# Patient Record
Sex: Male | Born: 1950 | Race: Black or African American | Hispanic: No | Smoking: Current every day smoker
Health system: Southern US, Community
[De-identification: ages and names within clinical notes are randomized; demographics above are authoritative.]

## PROBLEM LIST (undated history)

## (undated) DIAGNOSIS — K219 Gastro-esophageal reflux disease without esophagitis: Secondary | ICD-10-CM

## (undated) DIAGNOSIS — M1990 Unspecified osteoarthritis, unspecified site: Secondary | ICD-10-CM

## (undated) DIAGNOSIS — I10 Essential (primary) hypertension: Secondary | ICD-10-CM

## (undated) DIAGNOSIS — E119 Type 2 diabetes mellitus without complications: Secondary | ICD-10-CM

## (undated) HISTORY — PX: TRACHEOSTOMY: SUR1362

## (undated) HISTORY — PX: COLONOSCOPY: SHX174

---

## 2004-07-07 ENCOUNTER — Emergency Department: Payer: Self-pay | Admitting: Unknown Physician Specialty

## 2006-02-07 DIAGNOSIS — I639 Cerebral infarction, unspecified: Secondary | ICD-10-CM

## 2006-02-07 HISTORY — DX: Cerebral infarction, unspecified: I63.9

## 2008-10-24 ENCOUNTER — Emergency Department: Payer: Self-pay | Admitting: Emergency Medicine

## 2009-02-07 ENCOUNTER — Inpatient Hospital Stay: Payer: Self-pay | Admitting: Unknown Physician Specialty

## 2009-12-27 ENCOUNTER — Emergency Department: Payer: Self-pay | Admitting: Emergency Medicine

## 2012-03-12 ENCOUNTER — Emergency Department: Payer: Self-pay | Admitting: Emergency Medicine

## 2012-03-12 LAB — CBC
HGB: 13.3 g/dL (ref 13.0–18.0)
MCH: 29.2 pg (ref 26.0–34.0)
MCV: 88 fL (ref 80–100)
Platelet: 206 10*3/uL (ref 150–440)
RBC: 4.56 10*6/uL (ref 4.40–5.90)
RDW: 14.1 % (ref 11.5–14.5)

## 2012-03-12 LAB — COMPREHENSIVE METABOLIC PANEL
BUN: 14 mg/dL (ref 7–18)
Bilirubin,Total: 0.5 mg/dL (ref 0.2–1.0)
Calcium, Total: 8.6 mg/dL (ref 8.5–10.1)
Chloride: 104 mmol/L (ref 98–107)
EGFR (African American): >60
EGFR (Non-African Amer.): >60
Glucose: 126 mg/dL — ABNORMAL HIGH (ref 65–99)
Potassium: 3.4 mmol/L — ABNORMAL LOW (ref 3.5–5.1)
SGOT(AST): 23 U/L (ref 15–37)
SGPT (ALT): 30 U/L (ref 12–78)
Total Protein: 7.3 g/dL (ref 6.4–8.2)

## 2012-03-13 LAB — TROPONIN I: Troponin-I: 0.02 ng/mL

## 2012-04-06 ENCOUNTER — Inpatient Hospital Stay: Payer: Self-pay | Admitting: Internal Medicine

## 2012-04-06 LAB — URINALYSIS, COMPLETE
Bacteria: NONE SEEN
Blood: NEGATIVE
Hyaline Cast: 1
Leukocyte Esterase: NEGATIVE
Ph: 5 (ref 4.5–8.0)
Protein: NEGATIVE
RBC,UR: 3 /HPF (ref 0–5)
Specific Gravity: 1.02 (ref 1.003–1.030)

## 2012-04-06 LAB — CBC
HCT: 42.5 % (ref 40.0–52.0)
MCV: 89 fL (ref 80–100)
RBC: 4.76 10*6/uL (ref 4.40–5.90)
RDW: 14.2 % (ref 11.5–14.5)

## 2012-04-06 LAB — COMPREHENSIVE METABOLIC PANEL
Albumin: 3.6 g/dL (ref 3.4–5.0)
Alkaline Phosphatase: 56 U/L (ref 50–136)
BUN: 17 mg/dL (ref 7–18)
Chloride: 104 mmol/L (ref 98–107)
Co2: 25 mmol/L (ref 21–32)
Creatinine: 1.33 mg/dL — ABNORMAL HIGH (ref 0.60–1.30)
EGFR (Non-African Amer.): 57 — ABNORMAL LOW
Osmolality: 271 (ref 275–301)
Potassium: 3.6 mmol/L (ref 3.5–5.1)
SGOT(AST): 31 U/L (ref 15–37)
SGPT (ALT): 36 U/L (ref 12–78)
Sodium: 135 mmol/L — ABNORMAL LOW (ref 136–145)
Total Protein: 7.2 g/dL (ref 6.4–8.2)

## 2012-04-06 LAB — TROPONIN I: Troponin-I: <0.02 ng/mL

## 2012-04-07 LAB — COMPREHENSIVE METABOLIC PANEL
Bilirubin,Total: 0.7 mg/dL (ref 0.2–1.0)
Creatinine: 1.24 mg/dL (ref 0.60–1.30)
EGFR (African American): >60
Glucose: 63 mg/dL — ABNORMAL LOW (ref 65–99)
Potassium: 3.1 mmol/L — ABNORMAL LOW (ref 3.5–5.1)
SGPT (ALT): 33 U/L (ref 12–78)

## 2012-04-07 LAB — CBC WITH DIFFERENTIAL/PLATELET
Basophil #: 0 10*3/uL (ref 0.0–0.1)
Basophil %: 0.5 %
Eosinophil %: 0.9 %
HGB: 14.1 g/dL (ref 13.0–18.0)
Lymphocyte #: 1 10*3/uL (ref 1.0–3.6)
Lymphocyte %: 27.3 %
MCH: 29.6 pg (ref 26.0–34.0)
MCHC: 33.6 g/dL (ref 32.0–36.0)
MCV: 88 fL (ref 80–100)
Neutrophil %: 61.6 %
Platelet: 203 10*3/uL (ref 150–440)
RBC: 4.77 10*6/uL (ref 4.40–5.90)
WBC: 3.8 10*3/uL (ref 3.8–10.6)

## 2012-04-07 LAB — PROTIME-INR: INR: 1

## 2012-04-07 LAB — LIPID PANEL
HDL Cholesterol: 49 mg/dL (ref 40–60)
VLDL Cholesterol, Calc: 11 mg/dL (ref 5–40)

## 2012-04-07 LAB — TROPONIN I: Troponin-I: <0.02 ng/mL

## 2012-04-07 LAB — MAGNESIUM: Magnesium: 1.7 mg/dL — ABNORMAL LOW

## 2012-04-07 LAB — APTT: Activated PTT: 52.8 secs — ABNORMAL HIGH (ref 23.6–35.9)

## 2012-04-07 LAB — HEMOGLOBIN A1C: Hemoglobin A1C: 7.1 % — ABNORMAL HIGH (ref 4.2–6.3)

## 2012-04-08 DIAGNOSIS — I6789 Other cerebrovascular disease: Secondary | ICD-10-CM

## 2012-06-08 ENCOUNTER — Inpatient Hospital Stay: Payer: Self-pay | Admitting: Internal Medicine

## 2012-06-08 LAB — CBC WITH DIFFERENTIAL/PLATELET
Basophil #: 0.1 10*3/uL (ref 0.0–0.1)
Eosinophil %: 2.8 %
Lymphocyte #: 2.1 10*3/uL (ref 1.0–3.6)
Lymphocyte %: 44.7 %
MCH: 30.1 pg (ref 26.0–34.0)
MCV: 85 fL (ref 80–100)
Monocyte #: 0.6 x10 3/mm (ref 0.2–1.0)
Neutrophil %: 37.8 %
RDW: 14.3 % (ref 11.5–14.5)
WBC: 4.6 10*3/uL (ref 3.8–10.6)

## 2012-06-08 LAB — COMPREHENSIVE METABOLIC PANEL
Alkaline Phosphatase: 58 U/L (ref 50–136)
Anion Gap: 5 — ABNORMAL LOW (ref 7–16)
BUN: 16 mg/dL (ref 7–18)
Calcium, Total: 9.4 mg/dL (ref 8.5–10.1)
Co2: 29 mmol/L (ref 21–32)
Creatinine: 1.14 mg/dL (ref 0.60–1.30)
Potassium: 3.6 mmol/L (ref 3.5–5.1)
SGPT (ALT): 32 U/L (ref 12–78)
Sodium: 137 mmol/L (ref 136–145)
Total Protein: 7.4 g/dL (ref 6.4–8.2)

## 2012-06-08 LAB — PROTIME-INR
INR: 1
Prothrombin Time: 13 secs (ref 11.5–14.7)

## 2013-03-17 LAB — TROPONIN I: Troponin-I: 0.05 ng/mL

## 2013-03-17 LAB — COMPREHENSIVE METABOLIC PANEL
ALBUMIN: 4.5 g/dL (ref 3.4–5.0)
ALT: 52 U/L (ref 12–78)
AST: 26 U/L (ref 15–37)
Alkaline Phosphatase: 86 U/L
Anion Gap: 8 (ref 7–16)
BILIRUBIN TOTAL: 0.9 mg/dL (ref 0.2–1.0)
BUN: 13 mg/dL (ref 7–18)
CHLORIDE: 98 mmol/L (ref 98–107)
CREATININE: 1.39 mg/dL — AB (ref 0.60–1.30)
Calcium, Total: 10.9 mg/dL — ABNORMAL HIGH (ref 8.5–10.1)
Co2: 28 mmol/L (ref 21–32)
EGFR (African American): >60
EGFR (Non-African Amer.): 54 — ABNORMAL LOW
Glucose: 297 mg/dL — ABNORMAL HIGH (ref 65–99)
Osmolality: 279 (ref 275–301)
Potassium: 3.7 mmol/L (ref 3.5–5.1)
Sodium: 134 mmol/L — ABNORMAL LOW (ref 136–145)
Total Protein: 9.1 g/dL — ABNORMAL HIGH (ref 6.4–8.2)

## 2013-03-17 LAB — CBC WITH DIFFERENTIAL/PLATELET
BASOS ABS: 0 10*3/uL (ref 0.0–0.1)
BASOS PCT: 0.4 %
EOS PCT: 0.3 %
Eosinophil #: 0 10*3/uL (ref 0.0–0.7)
HCT: 47.1 % (ref 40.0–52.0)
HGB: 16 g/dL (ref 13.0–18.0)
LYMPHS PCT: 9.8 %
Lymphocyte #: 1.1 10*3/uL (ref 1.0–3.6)
MCH: 29.9 pg (ref 26.0–34.0)
MCHC: 34 g/dL (ref 32.0–36.0)
MCV: 88 fL (ref 80–100)
MONO ABS: 0.6 x10 3/mm (ref 0.2–1.0)
MONOS PCT: 5.5 %
NEUTROS ABS: 9.6 10*3/uL — AB (ref 1.4–6.5)
Neutrophil %: 84 %
PLATELETS: 238 10*3/uL (ref 150–440)
RBC: 5.37 10*6/uL (ref 4.40–5.90)
RDW: 14.1 % (ref 11.5–14.5)
WBC: 11.5 10*3/uL — AB (ref 3.8–10.6)

## 2013-03-17 LAB — URINALYSIS, COMPLETE
BILIRUBIN, UR: NEGATIVE
BLOOD: NEGATIVE
Bacteria: NEGATIVE
Glucose,UR: >=500 mg/dL (ref 0–75)
Ketone: NEGATIVE
Leukocyte Esterase: NEGATIVE
Nitrite: NEGATIVE
PH: 6 (ref 4.5–8.0)
Protein: 30
Specific Gravity: 1.029 (ref 1.003–1.030)

## 2013-03-17 LAB — LIPASE, BLOOD: Lipase: 74 U/L (ref 73–393)

## 2013-03-18 ENCOUNTER — Inpatient Hospital Stay: Payer: Self-pay | Admitting: Internal Medicine

## 2013-03-18 LAB — BASIC METABOLIC PANEL
ANION GAP: 9 (ref 7–16)
BUN: 15 mg/dL (ref 7–18)
CALCIUM: 10 mg/dL (ref 8.5–10.1)
Chloride: 101 mmol/L (ref 98–107)
Co2: 27 mmol/L (ref 21–32)
Creatinine: 1.35 mg/dL — ABNORMAL HIGH (ref 0.60–1.30)
EGFR (African American): >60
EGFR (Non-African Amer.): 55 — ABNORMAL LOW
Glucose: 207 mg/dL — ABNORMAL HIGH (ref 65–99)
Osmolality: 281 (ref 275–301)
Potassium: 3.2 mmol/L — ABNORMAL LOW (ref 3.5–5.1)
Sodium: 137 mmol/L (ref 136–145)

## 2013-03-18 LAB — APTT
ACTIVATED PTT: 35.5 s (ref 23.6–35.9)
ACTIVATED PTT: 136.9 s — AB (ref 23.6–35.9)
ACTIVATED PTT: 120.8 s — AB (ref 23.6–35.9)

## 2013-03-18 LAB — CK-MB
CK-MB: 9.1 ng/mL — AB (ref 0.5–3.6)
CK-MB: 9.5 ng/mL — ABNORMAL HIGH (ref 0.5–3.6)

## 2013-03-18 LAB — TROPONIN I
TROPONIN-I: 0.09 ng/mL — AB
TROPONIN-I: 0.1 ng/mL — AB
Troponin-I: 0.08 ng/mL — ABNORMAL HIGH

## 2013-03-18 LAB — CK TOTAL AND CKMB (NOT AT ARMC)
CK, Total: 403 U/L — ABNORMAL HIGH
CK-MB: 8.8 ng/mL — AB (ref 0.5–3.6)

## 2013-03-19 LAB — CBC WITH DIFFERENTIAL/PLATELET
Basophil #: 0.1 10*3/uL (ref 0.0–0.1)
Basophil %: 1.3 %
EOS ABS: 0 10*3/uL (ref 0.0–0.7)
Eosinophil %: 0.5 %
HCT: 43.6 % (ref 40.0–52.0)
HGB: 14.9 g/dL (ref 13.0–18.0)
LYMPHS PCT: 26.4 %
Lymphocyte #: 2.4 10*3/uL (ref 1.0–3.6)
MCH: 30.1 pg (ref 26.0–34.0)
MCHC: 34.1 g/dL (ref 32.0–36.0)
MCV: 88 fL (ref 80–100)
MONOS PCT: 9.2 %
Monocyte #: 0.8 x10 3/mm (ref 0.2–1.0)
Neutrophil #: 5.8 10*3/uL (ref 1.4–6.5)
Neutrophil %: 62.6 %
Platelet: 208 10*3/uL (ref 150–440)
RBC: 4.94 10*6/uL (ref 4.40–5.90)
RDW: 13.9 % (ref 11.5–14.5)
WBC: 9.2 10*3/uL (ref 3.8–10.6)

## 2013-03-19 LAB — BASIC METABOLIC PANEL
Anion Gap: 4 — ABNORMAL LOW (ref 7–16)
BUN: 13 mg/dL (ref 7–18)
CHLORIDE: 100 mmol/L (ref 98–107)
CREATININE: 1.28 mg/dL (ref 0.60–1.30)
Calcium, Total: 9.5 mg/dL (ref 8.5–10.1)
Co2: 31 mmol/L (ref 21–32)
EGFR (Non-African Amer.): 59 — ABNORMAL LOW
Glucose: 172 mg/dL — ABNORMAL HIGH (ref 65–99)
OSMOLALITY: 274 (ref 275–301)
Potassium: 3.3 mmol/L — ABNORMAL LOW (ref 3.5–5.1)
Sodium: 135 mmol/L — ABNORMAL LOW (ref 136–145)

## 2013-03-20 LAB — BASIC METABOLIC PANEL
Anion Gap: 7 (ref 7–16)
BUN: 15 mg/dL (ref 7–18)
CO2: 29 mmol/L (ref 21–32)
Calcium, Total: 9.4 mg/dL (ref 8.5–10.1)
Chloride: 100 mmol/L (ref 98–107)
Creatinine: 1.34 mg/dL — ABNORMAL HIGH (ref 0.60–1.30)
EGFR (African American): >60
EGFR (Non-African Amer.): 56 — ABNORMAL LOW
GLUCOSE: 146 mg/dL — AB (ref 65–99)
Osmolality: 275 (ref 275–301)
POTASSIUM: 3.5 mmol/L (ref 3.5–5.1)
Sodium: 136 mmol/L (ref 136–145)

## 2014-01-24 ENCOUNTER — Emergency Department: Payer: Self-pay | Admitting: Emergency Medicine

## 2014-05-30 NOTE — Consult Note (Signed)
CHIEF COMPLAINT and HISTORY:  Subjective/Chief Complaint right hand weakness/numbness   History of Present Illness Patient presents with right hand numbness and weakness.  Started yesterday.  No leg symptoms.  Speech appears OK.  No left sided symptoms.  No previous episodes.  Work up included MRI and CTA which I have independently reviewed.  He has an acute left sided stroke which is small.  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   Past History DM HTN Tobacco use.  counselled to stop smoking today   Past Medical Health Hypertension, Diabetes Mellitus, Stroke   PAST MEDICAL/SURGICAL HISTORY:  Past Medical History:   diabetes:    htn:    tracheostomy:   ALLERGIES:  Allergies:  Lisinopril: Angioedema, Chest Tightness  HOME MEDICATIONS:  Home Medications: Medication Instructions Status  clonidine 0.3 mg oral tablet 1 tab(s) orally 3 times a day Active  amlodipine 10 mg oral tablet 1 tab(s) orally once a day Active  pravastatin 80 mg oral tablet 1 tab(s) orally once a day (at bedtime) Active  aspirin 81 mg oral tablet 1 tab(s) orally once a day Active  hydrochlorothiazide 25 mg oral tablet 2 tab(s) orally once a day Active  hydrALAZINE 100 mg oral tablet 1 tab(s) orally 3 times a day Active  Lantus 100 units/mL subcutaneous solution 30 unit(s) subcutaneous once a day (at bedtime) Active   Family and Social History:  Family History Coronary Artery Disease  Diabetes Mellitus   Social History positive  tobacco, negative ETOH, works as a Horticulturist, commercial   + Tobacco Current (within 1 year)   Place of Living Home   Review of Systems:  Subjective/Chief Complaint right hand weakness and numbness   Fever/Chills No   Cough No   Sputum No   Abdominal Pain No   Diarrhea No   Constipation No   Nausea/Vomiting No   SOB/DOE No   Chest Pain No   Telemetry Reviewed NSR   Dysuria No   Tolerating Diet Yes   Medications/Allergies  Reviewed Medications/Allergies reviewed   Physical Exam:  GEN well developed, well nourished   HEENT hearing intact to voice, moist oral mucosa   NECK No masses  trachea midline  midline neck scar   RESP normal resp effort  clear BS  no use of accessory muscles   CARD regular rate  positive carotid bruits  no JVD   ABD denies tenderness  soft  normal BS   GU foley catheter in place  clear yellow urine draining   LYMPH negative neck, negative axillae   EXTR negative cyanosis/clubbing, negative edema   SKIN normal to palpation, No ulcers   NEURO right hand with fine motor deficits.  Gross strength is intact.  Has normal tone in all four extremities   PSYCH alert, A+O to time, place, person   LABS:  Laboratory Results: Hepatic:    28-Feb-14 18:53, Comprehensive Metabolic Panel  Bilirubin, Total 0.7  Alkaline Phosphatase 56  SGPT (ALT) 36  SGOT (AST) 31  Total Protein, Serum 7.2  Albumin, Serum 3.6    01-Mar-14 02:21, Comprehensive Metabolic Panel  Bilirubin, Total 0.7  Alkaline Phosphatase 56  SGPT (ALT) 33  SGOT (AST) 25  Total Protein, Serum 6.8  Albumin, Serum 3.5  Routine Chem:    28-Feb-14 18:53, Comprehensive Metabolic Panel  Glucose, Serum 85  BUN 17  Creatinine (comp) 1.33  Sodium, Serum 135  Potassium, Serum 3.6  Chloride, Serum 104  CO2, Serum 25  Calcium (Total), Serum 8.2  Osmolality (calc) 271  eGFR (African American) >60  eGFR (Non-African American) 57  eGFR values <20mL/min/1.73 m2 may be an indication of chronic  kidney disease (CKD).  Calculated eGFR is useful in patients with stable renal function.  The eGFR calculation will not be reliable in acutely ill patients  when serum creatinine is changing rapidly. It is not useful in   patients on dialysis. The eGFR calculation may not be applicable  to patients at the low and high extremes of body sizes, pregnant  women, and vegetarians.  Anion Gap 6    01-Mar-14 02:21, Comprehensive  Metabolic Panel  Glucose, Serum 63  BUN 16  Creatinine (comp) 1.24  Sodium, Serum 137  Potassium, Serum 3.1  Chloride, Serum 102  CO2, Serum 27  Calcium (Total), Serum 8.2  Osmolality (calc) 273  eGFR (African American) >60  eGFR (Non-African American) >60  eGFR values <18mL/min/1.73 m2 may be an indication of chronic  kidney disease (CKD).  Calculated eGFR is useful in patients with stable renal function.  The eGFR calculation will not be reliable in acutely ill patients  when serum creatinine is changing rapidly. It is not useful in   patients on dialysis. The eGFR calculation may not be applicable  to patients at the low and high extremes of body sizes, pregnant  women, and vegetarians.  Anion Gap 8    01-Mar-14 02:21, Hemoglobin A1c ([facility])  Hemoglobin A1c ([facility]) 7.1  The American Diabetes Association recommends that a primary goal of  therapy should be <7% and that physicians should reevaluate the  treatment regimen in patients with HbA1c values consistently >8%.    01-Mar-14 02:21, Lipid Profile ([place])  Cholesterol, Serum 121  Triglycerides, Serum 57  HDL (INHOUSE) 49  VLDL Cholesterol Calculated 11  LDL Cholesterol Calculated 61  Result(s) reported on 07 Apr 2012 at 02:57AM.    01-Mar-14 02:21, Magnesium, Serum  Magnesium, Serum 1.7  1.8-2.4  THERAPEUTIC RANGE: 4-7 mg/dL  TOXIC: > 10 mg/dL   -----------------------  Cardiac:    28-Feb-14 18:53, Troponin I  Troponin I < 0.02  0.00-0.05  0.05 ng/mL or less: NEGATIVE   Repeat testing in 3-6 hrs   if clinically indicated.  >0.05 ng/mL: POTENTIAL   MYOCARDIAL INJURY. Repeat   testing in 3-6 hrs if   clinically indicated.  NOTE: An increase or decrease   of 30% or more on serial   testing suggests a   clinically important change    01-Mar-14 02:21, Troponin I  Troponin I < 0.02  0.00-0.05  0.05 ng/mL or less: NEGATIVE   Repeat testing in 3-6 hrs   if clinically indicated.  >0.05 ng/mL: POTENTIAL    MYOCARDIAL INJURY. Repeat   testing in 3-6 hrs if   clinically indicated.  NOTE: An increase or decrease   of 30% or more on serial   testing suggests a   clinically important change    01-Mar-14 13:01, Troponin I  Troponin I < 0.02  0.00-0.05  0.05 ng/mL or less: NEGATIVE   Repeat testing in 3-6 hrs   if clinically indicated.  >0.05 ng/mL: POTENTIAL   MYOCARDIAL INJURY. Repeat   testing in 3-6 hrs if   clinically indicated.  NOTE: An increase or decrease   of 30% or more on serial   testing suggests a   clinically important change  Routine UA:    28-Feb-14 19:07, Urinalysis  Color (UA) Yellow  Clarity (UA) Hazy  Glucose (UA)  Negative  Bilirubin (UA) Negative  Ketones (UA) Negative  Specific Gravity (UA) 1.020  Blood (UA) Negative  pH (UA) 5.0  Protein (UA) Negative  Nitrite (UA) Negative  Leukocyte Esterase (UA) Negative  Result(s) reported on 06 Apr 2012 at 08:47PM.  RBC (UA) 3 /HPF  WBC (UA) <1 /HPF  Bacteria (UA)   NONE SEEN  Epithelial Cells (UA) <1 /HPF  Mucous (UA) PRESENT  Hyaline Cast (UA) 1 /LPF  Result(s) reported on 06 Apr 2012 at 08:47PM.  Routine Coag:    01-Mar-14 02:21, Activated PTT  Activated PTT (APTT) 52.8  A HCT value >55% may artifactually increase the APTT. In one study,  the increase was an average of 19%.  Reference: "Effect on Routine and Special Coagulation Testing Values  of Citrate Anticoagulant Adjustment in Patients with High HCT Values."  American Journal of Clinical Pathology 2006;126:400-405.    01-Mar-14 02:21, Prothrombin Time  Prothrombin 13.7  INR 1.0  INR reference interval applies to patients on anticoagulant therapy.  A single INR therapeutic range for coumarins is not optimal for all  indications; however, the suggested range for most indications is  2.0 - 3.0.  Exceptions to the INR Reference Range may include: Prosthetic heart  valves, acute myocardial infarction, prevention of myocardial  infarction, and  combinations of aspirin and anticoagulant. The need  for a higher or lower target INR must be assessed individually.  Reference: The Pharmacology and Management of the Vitamin K   antagonists: the seventh ACCP Conference on Antithrombotic and  Thrombolytic Therapy. DSKAJ.6811 Sept:126 (3suppl): [id number].  A HCT value >55% may artifactually increase the PT.  In one study,   the increase was an average of 25%.  Reference:  "Effect on Routine and Special Coagulation Testing Values  of Citrate Anticoagulant Adjustment in Patients with High HCT Values."  American Journal of Clinical Pathology 2006;126:400-405.  Routine Hem:    28-Feb-14 18:53, Hemogram, Platelet Count  WBC (CBC) 5.7  RBC (CBC) 4.76  Hemoglobin (CBC) 14.1  Hematocrit (CBC) 42.5  Platelet Count (CBC) 225  Result(s) reported on 06 Apr 2012 at 07:43PM.  MCV 89  MCH 29.5  MCHC 33.1  RDW 14.2    01-Mar-14 02:21, CBC Profile  WBC (CBC) 3.8  RBC (CBC) 4.77  Hemoglobin (CBC) 14.1  Hematocrit (CBC) 42.0  Platelet Count (CBC) 203  MCV 88  MCH 29.6  MCHC 33.6  RDW 14.4  Neutrophil % 61.6  Lymphocyte % 27.3  Monocyte % 9.7  Eosinophil % 0.9  Basophil % 0.5  Neutrophil # 2.4  Lymphocyte # 1.0  Monocyte # 0.4  Eosinophil # 0.0  Basophil # 0.0  Result(s) reported on 07 Apr 2012 at 02:48AM.   RADIOLOGY:  Radiology Results: XRay:    28-Feb-14 19:17, Chest Portable Single View  Chest Portable Single View  REASON FOR EXAM:    right side weakness  COMMENTS:       PROCEDURE: DXR - DXR PORTABLE CHEST SINGLE VIEW  - Apr 06 2012  7:17PM     RESULT: Comparison: 12/28/2009    Findings:     Single portable AP chest radiograph is provided.  There is no focal   parenchymal opacity, pleural effusion, or pneumothorax. Normal   cardiomediastinal silhouette. The osseous structures are unremarkable.    IMPRESSION:   No acute disease of the chest.    Dictation Site: 1        Verified By: Jennette Banker, M.D., MD  [place]:  01-Mar-14 08:29, US Carotid Doppler Bilateral  US Carotid Doppler Bilateral  REASON FOR EXAM:    cva /right carotid bruit  COMMENTS:       PROCEDURE: [place]  - US CAROTID DOPPLER BILATERAL  - Apr 07 2012  8:29AM     RESULT: Indication: Right carotid bruit    Comparison: None    Technique: Gray-scale, color Doppler, and spectral Doppler images were   obtained of the extracranial carotid artery systems and vertebral   arteries in the neck.    Findings:  On the right, there is mild sclerotic plaque within the distal CCA,     carotid bulb and proximal ICA. Maximum peak systolic velocity in the   right CCA is 143 cm/second. Maximum peak systolic velocity in the right   ICA is 169 cm/second. Maximum peak systolic velocity in the right ECA is   292 cm/second. The right ICA/CCA ratio is 1.18. This corresponds to a   stenosis of less than 50 %. Antegrade blood flow is documented in the   right vertebral artery.    On the left, there is a moderate-severe atherosclerotic plaque within the   distal CCA resulting in 75% height 95% visual stenosis.. Maximum peak   systolic velocityin the left CCA is 180 cm/second. There is complete   occlusion of the left ICA. Antegrade blood flow is documented in the left   vertebral artery.    IMPRESSION:    1. Complete occlusion of the left ICA. Recommend CTA or MRA of the neck     for confirmation.  2. There is approximately 50 % visual stenosis of the distal right CCA   with mildly elevated velocities in the proximal ICA.    Dictation Site: 1        Verified By: Jennette Banker, M.D., MD  LabUnknown:    28-Feb-14 18:52, CT Head Without Contrast  PACS Image    28-Feb-14 19:17, Chest Portable Single View  PACS Image    01-Mar-14 08:29, US Carotid Doppler Bilateral  PACS Image    01-Mar-14 11:23, MRI Brain Without Contrast  PACS Image    01-Mar-14 12:25, CT Angiography Neck (Carotids)  PACS Image  MRI:    01-Mar-14 11:23, MRI Brain Without  Contrast  MRI Brain Without Contrast  REASON FOR EXAM:    cva  COMMENTS:       PROCEDURE: MR  - MR BRAIN WO CONTRAST  - Apr 07 2012 11:23AM     RESULT: History: CVA    Technique: Multiplanar, multisequence MRI of the brain was obtained   without IV contrast.     Comparison:  None    Findings:     There is restricted diffusion in the left frontal lobe and left parietal     lobe with corresponding ADC hypointensity consistent with an acute   infarction. There is no hemorrhage. There is no pathologic extra-axial   fluid collection. There is generalized cerebral atrophy. There is   subcortical and deep white matter T2 and FLAIR hyperintensity likely   secondary to microangiopathy. There is no hydrocephalus. The ventricles   are normal for age. The basal cisterns are patent. There areno abnormal   extra-axial fluid collections.     The visualized paranasal sinuses and mastoid sinuses are clear. The skull   base and calvarium demonstrate normal signal. The major intracranial flow   voids, including dural venous sinuses appear patent.    IMPRESSION:     Acute nonhemorrhagic left  frontal lobe and left parietal lobe infarcts.  Dictation Site: 1        Verified By: Jennette Banker, M.D., MD  CT:    28-Feb-14 18:52, CT Head Without Contrast  CT Head Without Contrast  REASON FOR EXAM:    ACUTE RIGHT SIDE WEAKNESS AT 6:15PM  COMMENTS:       PROCEDURE: CT  - CT HEAD WITHOUT CONTRAST  - Apr 06 2012  6:52PM     RESULT: Comparison:  None    Technique: Multiple axial images from the foramen magnum to the vertex   were obtained without IV contrast.    Findings:      There is no evidence of mass effect, midline shift, or extra-axial fluid   collections.  There is no evidence of a space-occupying lesion or   intracranial hemorrhage. There is no evidence of a cortical-based area of     acute infarction. There is generalized cerebral atrophy. There is   periventricular white matter  low attenuation likely secondary to   microangiopathy.    The ventricles and sulci are appropriate for the patient's age. The basal   cisterns are patent.    Visualized portions of the orbits are unremarkable. The visualized   portions of the paranasal sinuses and mastoid air cells are unremarkable.     The osseous structures are unremarkable.    IMPRESSION:      No acute intracranial process.  Dictation Site: 1        Verified By: Jennette Banker, M.D., MD    01-Mar-14 12:25, CT Angiography Neck (Carotids)  CT Angiography Neck (Carotids)  REASON FOR EXAM:    carotid occlusion and CVA  COMMENTS:       PROCEDURE: CT  - CT ANGIOGRAPHY NECK W/CONTRAST  - Apr 07 2012 12:25PM     RESULT: Indication: CVA    Comparisons: None    Technique: 100 ml of Isovue 370 was administered and the extracranial   carotid arteries bilaterally were scanned during the arterial phase from   the level of the superior portion of the frontal sinuses to the proximal   neck. These images were then transferred to the Siemens work station and   were subsequently reviewed utilizing 3-D reconstructions and MIP images.  Findings:     A three-vessel configuration of the aortic arch is identified with normal   ostium. The vertebral arteries are identified arising from the subclavian   arteries and entering the transverse foramen bilaterally at C6.     The carotid arteries are identified and are symmetric and unremarkable.   There is no evidence of aneurysm or carotid dissection bilaterally.     Right Carotid Artery: The right CCA is patent with approximately 50%   stenosis of the distal CCA just proximal to the carotid bulb. The right   ICA is patent without focal stenosis.    Left Carotid Artery: There is moderate atherosclerotic plaque within the   distal left CCA resulting in approximately 50% stenosis.There is     complete occlusion of the left ICA at its origin. There is reconstitution   of the  left intracranial circulation through a patent anterior and   posterior communicating artery.    The visualized portions of the brain are unremarkable.     There is degenerative disc disease throughout the cervical spine.    The lung apices are clear.    IMPRESSION:     1. The right CCA is patent with  approximately 50% stenosis of the distal   CCA just proximal to the carotid bulb. The right ICA is patent without   focal stenosis.  2. There is moderate atherosclerotic plaque within the distal left CCA   resulting in approximately 50% stenosis. There is complete occlusion of   the left ICA at its origin.      Dictation Site: 1        Verified By: Jennette Banker, M.D., MD   ASSESSMENT AND PLAN:  Assessment/Admission Diagnosis Acute left hemispheric stroke Left carotid occlusion. Likely acute with new symptoms tobacco dependence DM HTN   Plan With a possible acute occlusion of the left ICA and stroke, would consider anticoagulation for six months followed by lifelong anti-platelets. Discussed that no operative or interventional options of benefit Discussed need for smoking cessation, BP and sugar control Will follow carotid stensis as an outpatient.  Current right ICA stenosis mild.     Level 4   Electronic Signatures: Algernon Huxley (MD)  (Signed 01-Mar-14 14:08)  Authored: Chief Complaint and History, PAST MEDICAL/SURGICAL HISTORY, ALLERGIES, HOME MEDICATIONS, Family and Social History, Review of Systems, Physical Exam, LABS, RADIOLOGY, Assessment and Plan   Last Updated: 01-Mar-14 14:08 by Algernon Huxley (MD)

## 2014-05-30 NOTE — H&P (Signed)
PATIENT NAME:  Richard Stark, Richard Stark MR#:  [id number] DATE OF BIRTH:  [birth year]  DATE OF ADMISSION:  06/08/2012  PRIMARY CARE PHYSICIAN: At [facility].  VASCULAR SURGEON:  Festus Barren, MD  CHIEF COMPLAINT: Right upper extremity stiffness.   HISTORY OF PRESENT ILLNESS:  The patient is a 64 year old male with past medical history of hypertension, insulin-dependent diabetes mellitus, hyperlipidemia and recent history of acute left frontoparietal infarct with right hand numbness in March 2014 who was discharged home with aspirin 81 mg and Plavix 75 mg p.o. daily. The patient has been taking his medications on a regular basis as prescribed, but suddenly he developed right upper extremity weakness last night while he was about to sleep.  He is reporting that the right arm suddenly became very stiff and he could not fall asleep. The right lower extremity was fine. Denies any headache or blurry vision. He denies any chest pain or shortness of breath either. No difficulty with swallowing. Denies any black spots or floaters in front of his right eye. Since the patient's last discharge he quit smoking completely. In fact the patient drove from home to the ER. He said following the discharge from previous admission, the right hand numbness significantly improved and he started functioning okay. He was able to feed himself. Repeat CAT scan of the head in the ER has revealed new acute small left frontal infarct with no hemorrhage. Hospitalist team is called to admit the patient for new acute CVA.  During my examination, the patient is still feeling weak with decreased sensation in the right upper extremity. Denies any other complaints. Denies any urinary or fecal incontinence.   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   PAST SURGICAL HISTORY: Tracheostomy in 2011 after angioedema due to ACE inhibitor, lisinopril.   ALLERGIES: PENICILLIN AND ACE INHIBITOR due to angioedema.  PSYCHOSOCIAL HISTORY: Lives at home with niece.  He used to smoke but quit smoking since previous admission with diagnoses of left-sided stroke, in February 2014. Denies alcohol or illicit drug usage.   FAMILY HISTORY: Father had MI in mid 23s. Sister has diabetes mellitus.   HOME MEDICATIONS:   1.  Pravastatin 80 mg p.o. once daily. 2.  Metoprolol 25 mg 1/2 tablet p.o. b.i.d. 3.  Insulin Lantus 10 units sub-Q once daily. 4.  Hydrochloride 25 mg twice a day. 5.  Hydralazine 100 mg 3 times a day. 6.  Plavix 75 mg once daily. 7.  Clonidine 0.3 mg 3 times a day. 8.  Aspirin 81 mg once daily.  REVIEW OF SYSTEMS:   CONSTITUTIONAL: Denies any fever, fatigue, weakness, pain, weight loss or weight gain.  EYES: Denies any blurry vision, glaucoma, cataracts or redness in his eyes.  EARS, NOSE, THROAT: No tinnitus, epistaxis, discharge, postnasal drip, or difficulty in swallowing.  RESPIRATORY:  Denies COPD, hemoptysis, dyspnea, asthma.  CARDIOVASCULAR: No chest pain, palpitations or syncope.  GASTROINTESTINAL: No nausea, vomiting, diarrhea or GERD.  GENITOURINARY: No dysuria or hematuria. Denies any hernia or prostatitis.   ENDOCRINE:  Denies thyroid problems or nocturia. Has a history of insulin-dependent diabetes mellitus.  HEMATOLOGIC AND LYMPHATIC: No anemia, easy bruising.  No bleeding.  INTEGUMENTARY: No acne, rash or  lesions.  MUSCULOSKELETAL: No joint pain in back, shoulder or neck. Denies any history of gout or redness.  NEUROLOGIC: Recent history of stroke. Denies any ataxia, dementia or headache.  PSYCHIATRIC: Denies any ADD, OCD or bipolar disorder.   PHYSICAL  EXAMINATION: VITAL SIGNS: Temperature 98 degrees Fahrenheit, pulse 54, respirations 18, blood pressure during my examination was at 233/116 regarding which clonidine 0.1 mg was given. Repeat blood pressure, systolic, at 202.  GENERAL APPEARANCE: Not in any acute distress. Moderately built and moderately nourished. Seems to be a little anxious.  HEENT: Normocephalic, atraumatic. Pupils are equally reacting to light and accommodation. No scleral icterus. No conjunctival injection. Extraocular movements are intact. No sinus tenderness. No postnasal drip. No pharyngeal exudates.  NECK: Supple. No JVD. No thyromegaly. No lymphadenopathy.  LUNGS: Clear to auscultation bilaterally. No accessory muscle usage.  Anterior wall tenderness on palpation.  HEART:  S1 and S2 normal. Regular rate and rhythm. No gallops. No thrills.  GASTROINTESTINAL: Soft. Bowel sounds are positive in all 4 quadrants. Nontender and nondistended. No masses felt.  NEUROLOGIC: Awake, alert and oriented x 3. Cranial nerves II through XII are intact. Motor and sensory decreased sensation in the right upper extremity and motor is also decreased on the right upper extremity. Left upper extremity and bilateral lower extremities normal motor and sensory. Reflexes are 2+.  Gait normal. No pronator drift. No cerebellar signs. EXTREMITIES: No edema. No sinus. No clubbing.  MUSCULOSKELETAL: No joint effusion, tenderness or erythema.  PSYCHIATRIC: Normal mood and affect.  SKIN: No rashes, lesions. Normal turgor. Warm to touch.  PSYCHIATRIC: Normal mood and affect.   LABORATORY DATA AND IMAGING STUDIES: The patient's CBC still pending at this time.  WBC 4.6, hemoglobin 13.7, hematocrit 38.7 and platelets 186. LFTs are normal. BUN 16, creatinine 1.14, sodium 137, potassium 3.6, chloride 103, CO2 29, GFR greater than 60, anion gap 5, serum osmolality 276, calcium 9.4, glucose 116.   Twelve lead EKG has revealed sinus bradycardia at 50 beats per  minute, normal PR interval, nonspecific T wave abnormalities and PVCs.  CAT scan of the head has revealed new small left frontal infarct, possibly acute. No hemorrhage or extra-axial collection.  Presumed microvascular changes in the supratentorial white matter.    ASSESSMENT AND PLAN:  1.  New acute left frontal stroke causing right upper extremity weakness with numbness.  The plan is to admit him to inpatient care. We will give him Aggrenox and statin. Will get neuro checks. Neurology consult is placed.  Also vascular surgery consult is placed to Dr. Wyn Quaker regarding 50% carotid stenosis.  I will get swallow evaluation by RN at bedside.  If necessary we will consult speech therapy regarding swallow evaluation. Will get PT and occupational therapy consult regarding the right arm stiffness. MRA of the brain will be obtained.  He needs risk factor modification.  The patient's hemoglobin A1c from previous admission in February is 7.1. The patient is on 80 mg of statin. The patient quit smoking since his previous admission.  2.  Malignant hypertension. This could be rebound hypertension from clonidine usage.  We will discontinue clonidine in view of possible rebound hypertension and significant sinus bradycardia. We will increase the dose of metoprolol with close monitoring for bradycardia. Will allow permissive hypertension for cerebral perfusion. Will give the patient hydralazine IV as needed basis for systolic blood pressure greater than 200 only. 3.  Sinus bradycardia. Discontinued clonidine.  4.  Insulin-dependent diabetes mellitus. The patient will be on the  Lantus and sliding scale insulin.  5.  Hyperlipidemia. Continue statin.  6.  Gastrointestinal prophylaxis and deep vein thrombosis prophylaxis with ranitidine and Lovenox subcutaneous.   CODE STATUS: FULL CODE.   The plan of care was discussed in detail with the patient. He is aware of the plan.  TOTAL TIME SPENT ON ADMISSION:  50  minutes.  ____________________________ Ramonita LabAruna Analicia Skibinski, MD ag:sb D: 06/08/2012 06:34:17 ET T: 06/08/2012 07:03:57 ET JOB#: [id number]  cc: Ramonita LabAruna Neyah Ellerman, MD, <Dictator> Ramonita LabARUNA Myka Lukins MD ELECTRONICALLY SIGNED 06/18/2012 0:47

## 2014-05-30 NOTE — H&P (Signed)
PATIENT NAME:  Richard Stark, Richard Stark MR#:  [id number] DATE OF BIRTH:  Jun 24, 1950  DATE OF ADMISSION:  04/06/2012  PRIMARY CARE PHYSICIAN:  Nonlocal.  The patient goes to [employer].   REFERRING PHYSICIAN:  Dr. Governor Rooksebecca Lord.   CHIEF COMPLAINT:  Right upper extremity weakness and tingling.   HISTORY OF PRESENT ILLNESS:  The patient is a very nice 64 year old gentleman who has history of hypertension, hypercholesterolemia, insulin-dependent diabetes, who comes today with a history of numbness of the right upper extremity that started whenever he was at work around 2:00 p.m.  He went home.  He was feeling okay, but the numbness got worse and he became weak on his right upper extremity.  He called EMS to check him out and around 8:00 he was brought here to the hospital and at the moment of arrival he was aphasic.    The patient did have some improvement after he given some aspirin and right now he is able to express himself, although he was very frustrated and upset of not being able to talk for a while.  He is happy that this is returning.  He denies any chest pain, shortness of breath or weakness anywhere else on his body.  He is in company of his son.  He denies any episodes like this happen before.  He actually had an EKG about a week ago with his primary care doctor and he said that his blood pressure was doing okay.    The patient is admitted for treatment of a stroke and following work-up.   REVIEW OF SYSTEMS:  CONSTITUTIONAL:  Denies any fever, fatigue, weakness, weight loss or weight gain.  EYES:  Denies any blurry vision, double vision, glaucoma or cataracts.  EARS, NOSE, THROAT:  No tinnitus.  No ear pain.  No hearing loss.  No sinus pain.  The patient states that he has not difficulty swallowing, but he was really concerned whenever he had the problem speaking.  RESPIRATORY:  No cough.  No wheezing.  No COPD.  No painful respirations.  CARDIOVASCULAR:  No chest pain, orthopnea,  syncope or palpitations.  GASTROINTESTINAL:  No nausea, vomiting, diarrhea or rectal bleeding.  GENITOURINARY:  No dysuria, hematuria or changes in frequency, no erectile problems as far as he can tell.  No prostatitis.  ENDOCRINE:  No polyuria, polydipsia or polyphagia.  No cold or heat intolerance.  He has diabetes which is being well-controlled.  HEMATOLOGIC AND LYMPHATIC:  No anemia, easy bruising, bleeding.  MUSCULOSKELETAL:  No pain on neck, shoulder or other places.  NEUROLOGIC:  No numbness, tingling, ataxia, CVAs or TIAs.  PSYCHIATRIC:  No anxiety, insomnia or nervousness.  The patient denies having any CVAs or TIAs in the past.  Right now his main complaint was the numbness and weakness, but he has never had them before.  No ataxia.   PAST MEDICAL HISTORY: 1.  Hypertension.  2.  Insulin-dependent diabetes type 2.  3.  History of angioedema, status post tracheostomy.  4.  Hypercholesterolemia.   PAST SURGICAL HISTORY:  Tracheostomy done in 2011 after angioedema due to ACE inhibitor lisinopril.   ALLERGIES:  The patient is allergic to PENICILLIN AND ACE INHIBITORS DUE TO ANGIOEDEMA.   SOCIAL HISTORY:  The patient smokes half pack a day.  He has been smoking for over 40 years.  He has received cessation counseling by myself at this moment for over 8 minutes and the patient states that he will quit today.  Denies any  alcohol use.  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   FAMILY HISTORY:  History of diabetes in his sister.  His father had an MI in his 73s, almost 57s and he denies any CVA or cancer in the family.   CURRENT MEDICATIONS:  Aspirin 81 mg once daily which he was not taking for over a week, pravastatin 80 mg once daily, Lantus 30 units every night, hydralazine 100 mg 3 times daily, hydrochlorothiazide 25 mg take 2 tablets once a day, EpiPen as needed, clonidine 0.3 mg 3 times a day, amlodipine 10 mg once a day.   PHYSICAL EXAMINATION: VITAL SIGNS:  Blood pressure  131/70, pulse 101, respirations 22, temperature 97.8, oxygen saturation 100% on room air.  GENERAL:  Alert, oriented x 3.  No acute distress.  No respiratory distress.  Hemodynamically stable.  HEENT:  Pupils are equal, round, reactive.  Extraocular movements are intact.  Mucosa are moist.  Anicteric sclerae.  Pink conjunctivae.  No oral lesions.  NEUROLOGIC:  Cranial nerves II through XII seem intact.  The only thing that he is showing right now, as far as that is some slight drop of the left corner of his mouth.  His tongue is central.  His uvula is central.  His strength is 5 out of 5 from lower extremities, equal, 5 out of 5 on left upper extremity.  Right upper extremity is weak.  He has a pronator drift.  He has significant weakness with making a fist or squeezing my fingers compared with his left upper extremity.  He has decreased sensation distally on his right upper extremity.  His DTRs are equal on all 4 extremities +2.  No problems with rapid alternating movements on his left side, but his right upper extremity is very slow at this moment.  I did not ask the patient to get up and walk due to his acute stroke right now.  NECK:  Supple.  No JVD.  No thyromegaly.  No adenopathy.  Positive carotid bruits on the right side.  CARDIOVASCULAR:  Regular rate and rhythm.  No murmurs, rubs or gallops are appreciated.  Maybe a slight increase on PMI lateral to anterior axillary line.  No tenderness to palpation of the anterior chest wall.  LUNGS:  Clear without any wheezing or crepitus.  No use of accessory muscles.  No dullness to percussion.  ABDOMEN:  Soft, nontender, nondistended.  No hepatosplenomegaly.  No masses.  Bowel sounds are positive.  GENITAL:  Negative for external lesions.  EXTREMITIES:  No edema, no cyanosis, no clubbing.  Pulses +2.  Capillary refill less than 3.  PSYCHIATRIC:  Alert, oriented x 3.  No signs of depression.  SKIN:  Without any rashes or petechiae.  LYMPHATIC:  Negative  for lymphadenopathy in neck or supraclavicular areas.  MUSCULOSKELETAL:  Without any joint effusions.   LABORATORY DATA:  Glucose 85, creatinine 1.33, sodium 135.  Other electrolytes within normal limits.  LFTs within normal limits.  Troponin is negative.  White count is 5.7, hemoglobin is 14.1, platelets are 225.  UA without any signs of urine infection.  CT scan without any acute abnormalities at this moment.  EKG, LVH criteria, sinus tachycardia with occasional PACs.  There is an ST depression on lateral leads and first and second leads as well which could be related to LVH.   ASSESSMENT AND PLAN:  The patient is a 64 year old gentleman with acute stroke who has history of hypertension, dyslipidemia and diabetes.  He is a current smoker.   1.  Cerebrovascular accident.  The patient had acute stroke, still right upper extremity weakness, significant aphasia for what we are going to do an MRI in the morning since the patient has not shown any signs of his recently done CT scan.  The patient had significant symptoms.  He was not necessarily a full TPA candidate and he was discussed with the family if he will need TPA or not.  The family decided not to go with it and he was by the end of the window of getting TPA for what ER physician decided not to do at this moment.  He is actually getting better which would be an indication of not to do it anyway.  The patient was off aspirin.  At this moment we are going to put him on full aspirin.  We are going to allow permissive hypertension since the patient is a smoker and he had a carotid bruit, we are going to do an ultrasound of the neck, MRI in the morning and an echocardiogram since the patient has changes of his EKG related with left ventricular hypertrophy.  Neurology consultation, neuro checks are to be done with the patient as well.  2.  Changes of EKG.  We are going to do serial troponins.  These ST-T wave inversions without ST depression are likely due to  left ventricular hypertrophy strain.  We are going to do troponins serials and an echocardiogram in the morning.  3.  Dyslipidemia.  The patient is to continue pravastatin 80 mg once daily.  He might not be as compliant as he said he is.  We are going to check a lipid panel in the morning and due to his stroke he needs to be on pravastatin or a statin in general.   4.  Hypertension.  The patient has blood pressure in the 140s to 120s.  At this moment we are going to allow permissive hypertension.  We are not going to treat blood pressures unless they are above 200 on the systolic and 110 on the diastolic.  5.  Diabetes.  Continue insulin sliding scale for now, but hold Lantus.  The patient is not taking oral medications for his diabetes.  In the past he was on glyburide, but he was taken off after he started Lantus.  6.  History of angioedema.  Avoid Ace inhibitors.  7.  Smoking.  The patient has been given smoking cessation for over 8 minutes.  He states that he is going to quit smoking now.  8.  Gastrointestinal prophylaxis with PPI.  9.  Deep vein thrombosis prophylaxis with heparin starting tomorrow.  10.  THE PATIENT IS A FULL CODE.   time spend 45 min.  ____________________________ Felipa Furnace, MD rsg:ea D: 04/06/2012 22:04:37 ET T: 04/07/2012 00:47:49 ET JOB#: [id number]  cc: Felipa Furnace, MD, <Dictator> Jaculin Rasmus Juanda Chance MD ELECTRONICALLY SIGNED 04/24/2012 12:50

## 2014-05-30 NOTE — Discharge Summary (Signed)
PATIENT NAME:  Richard Stark, Richard Stark MR#:  [id number] DATE OF BIRTH:  [birth year]  DATE OF ADMISSION:  04/06/2012 DATE OF DISCHARGE:  04/09/2012  ADMITTING PHYSICIAN: Dr. Mordecai Maes.   DISCHARGING PHYSICIAN: Dr. Enid Baas.    PRIMARY CARE PHYSICIAN: At [facility].   DISCHARGE DIAGNOSES: 1.  Acute left frontoparietal infarct with right hand numbness.  2.  Hypertension.  3.  Insulin-dependent diabetes mellitus.  4.  Hyperlipidemia.  5.  Tobacco use disorder.  6.  Complete left carotid artery occlusion.   DISCHARGE MEDICATIONS: 1.  Fluoxetine 10 mg p.o. daily.  2.  Glargine insulin 10 units subcu daily.  3.  Aspirin 81 mg p.o. daily.  4.  Plavix 75 mg p.o. daily.  5.  HCTZ 25 mg p.o. b.i.d.  6.  Hydralazine 25 mg p.o. b.i.d.  7.  Lovastatin 40 mg p.o. daily.   DISCHARGE DIET: Low sodium, ADA 1800 calorie, low fat diet.   DISCHARGE ACTIVITY: As tolerated.    FOLLOWUP INSTRUCTIONS: 1.  PCP followup  for blood pressure and blood sugar check and medication adjustment in 1 week.  2.  Smoking cessation.  3.  Carotid ultrasound for followup of left complete internal carotid artery occlusion and right 50% common carotid artery occlusion in 4 to 6 weeks.   LABS AND IMAGING STUDIES: 1.  Echo with normal left ventricular systolic function, EF is estimated to be 60% to 65%. Mildly dilated left atrium and concentric left ventricular hypertrophy. No cardiac source of emboli noted.  2.  CT angiography of the neck showing right CCA is patent with approximately 50% stenosis of the distal common carotid artery just proximal to the carotid bulb.  The right ICA is patent without focal stenosis. There is moderate atherosclerotic plaque within the left distal common carotid artery resulting in 50% stenosis. There is complete occlusion of the left ICA at its origin.  3.  MRI of the brain without contrast showing acute nonhemorrhagic left frontal lobe and left parietal lobe infarcts.  4.   Ultrasound Dopplers bilateral carotids showing 50% occlusion of the distal CCA and complete occlusion of left ICA noted.  5.  WBC 3.8, hemoglobin 14.1, hematocrit 42.0, platelet count 203.  6.  Sodium 137, potassium 3.1, chloride 102, bicarb 27, BUN 16, creatinine 1.2, glucose 63 and calcium of 8.2.  7.  ALT 33, AST 25, alkaline phosphatase 56, total bilirubin 0.7, albumin 3.5, magnesium 1.7.  8.  HbA1c 7.1.  9.  LDL cholesterol 61, HDL 49, total cholesterol 914, and triglycerides 57.  10.  Cardiac enzymes negative.  11.  INR 1.0.  12.  Chest x-ray on admission showing no acute disease of the chest. 13.  Urinalysis negative for any infection.  14.  CT of the head on admission without contrast showing no acute intracranial process.   BRIEF HOSPITAL COURSE: The patient is a 64 year old African American male with past medical history significant for hypertension, insulin-dependent diabetes mellitus and smoking who presents to the hospital secondary to right arm numbness and also slurred speech and confusion.  1.  Acute CVA.  The patient's symptoms persisted for more than 24 hours. Initial CT of the head was negative so the patient had an MRI done which did show acute left frontoparietal infarcts which accounted for his right-sided numbness and speech deficit. His speech has been completely recovered. He still has numbness of his left hand but much improved than presentation. Carotid Doppler showed left internal carotid artery occlusion, which could have been  the cause for the stroke. Echo did not show any cardiac source of emboli. He does have significant atherosclerotic disease in both carotid arteries. Vascular consultation was obtained by Dr. Wyn Quakerew. Because of his acute nature of the stroke, anyway, no procedures were recommended at least for the next 2 weeks and IV anticoagulation with heparin could not be given due to the same as risk of intracranial bleeding would be high following the acute stroke.  Dr. Wyn Quakerew felt that this occlusion was probably acute, resulting in the stroke and recommended aspirin and Plavix, and followup ultrasound especially for the right side, which is currently 50% blocked, to prevent further occlusion. Risk factors management like taking a statin for cholesterol, low sodium diet, smoking cessation were also advised to the patient. The patient worked with physical therapy and had no rehab needs. Speech pathology also evaluated the patient and he did not have any trouble swallowing his food.  2.  Hypertension. He was on several blood pressure medications at home. He was maintained on. permissive hypertension because of his acute stroke, however, his blood pressure was not really very high so he is being only discharged on 2 blood pressure medications at this time.  3.  Diabetes mellitus. Hemoglobin A1c was 7.1.  The patient was on 30 units of glargine. Because he was almost hypoglycemic in the hospital, he is being discharged on 10 units of glargine and recommended to follow up with primary care physician in the next week for medication readjustment.   4.  Hyperlipidemia. He is on a statin at the time of discharge. His course has been otherwise uneventful in the hospital.   DISCHARGE CONDITION: Stable.   DISCHARGE DISPOSITION: Home.   TIME SPENT ON DISCHARGE: 45 minutes.     ____________________________ Enid Baasadhika Kalisetti, MD rk:cs D: 04/10/2012 17:36:00 ET T: 04/10/2012 19:59:56 ET JOB#: [id number]  cc: Enid Baasadhika Kalisetti, MD, <Dictator> Enid BaasADHIKA KALISETTI MD ELECTRONICALLY SIGNED 04/11/2012 13:34

## 2014-05-30 NOTE — Discharge Summary (Signed)
PATIENT NAME:  Richard Stark, Richard H MR#:  [id number] DATE OF BIRTH:  October 17, 1950  DATE OF ADMISSION:  06/08/2012 DATE OF DISCHARGE:  06/08/2012  PRESENTING COMPLAINT: Right upper extremity weakness.  DISCHARGE DIAGNOSES:   1.  Accelerated/malignant hypertension, improved.  2.  Right upper extremity weakness, stable.  3.  Recent acute recent left frontoparietal cerebrovascular accident.  4.  Hypertension.  5.  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  6.  Type 2 diabetes.   CONDITION ON DISCHARGE: Fair.   CODE STATUS: Full code.   FO[facility]UP: [facility] 2 weeks.   PROCEDURES: None.   MEDICATIONS AT DISCHARGE: 1.  Clonidine 0.3 mg 1 tablet 3 times a day.  2.  Metoprolol 25 mg 1/2 tablet twice a day. 3.  Hydrochlorothiazide 25 mg b.i.d.  4.  Plavix 75 mg daily.  5.  Aspirin 81 mg daily.  6.  Insulin Lantus 10 units daily.  7.  Pravastatin 80 mg at bedtime.  7.  Hydralazine 100 mg 3 times a day.   LABORATORY AND DIAGNOSTIC DATA:  MRI of the brain without contrast shows findings that likely represent resolving changes from the recent prior acute infarct in the left frontal parietal lobe, plus a little area of superimposed acute infarct in this area cannot be excluded. The CBC within normal limits.  Comprehensive metabolic panel within normal limits. CT of the head is essentially showing no acute stroke.   HOSPITAL COURSE:  This patient is a [age] year old African American gentleman who recently had acute left frontoparietal stroke in February 2014, who comes in with right arm weakness. The patient was admitted with right upper extremity weakness in the setting of accelerated/malignant hypertension.  He was admitted in the Intensive Care Unit. The patient's blood pressure on arrival in the Emergency Room systolic was around 230, diastolic in the 110s. He received some blood pressure medications and his home meds were resumed which were clonidine, hydrochlorothiazide,  hydralazine and metoprolol. The patient did have transient bradycardia which resolved. He tolerated all the medications well, blood pressure was stabilized.   The patient's symptoms of right upper extremity weakness improved. He does have chronic deficit from his previous stroke in February 2014. A repeat MRI did not show any acute intracerebral CVA. The patient was recommended to get a blood pressure instrument for home and keep a log of blood pressure at home and watch salt in his diet.   Carotid artery stenosis, left, complete.  The patient is already on aspirin and Plavix which we will continue.   He is [facility] and no further measures need to be taken per the patient.  Type 2 diabetes.  Lantus was continued.   Hyperlipidemia on statin.  Hospital stay otherwise remained stable. The patient ambulated well prior to discharge.  The discharge plan was discussed with the patient's niece.  Time spent:  40 minutes.   ____________________________ Wylie HailSona A. Allena KatzPatel, MD sap:ct D: 06/09/2012 06:47:22 ET T: 06/09/2012 11:55:17 ET JOB#: [id number]  cc: Alorah Mcree A. Allena KatzPatel, MD, <Dictator> [facility] Willow OraSONA A Fredi Geiler MD ELECTRONICALLY SIGNED 06/26/2012 15:19

## 2014-05-31 NOTE — Consult Note (Signed)
PATIENT NAME:  Richard Stark, Richard Stark MR#:  [id number] DATE OF BIRTH:  1950-09-14  DATE OF CONSULTATION:  03/18/2013  REFERRING PHYSICIAN:  Susa Griffins, MD CONSULTING PHYSICIAN:  Dow Adolph, MD  REASON FOR THE CONSULT: Nausea and vomiting.  HISTORY OF PRESENT ILLNESS: Richard Stark is a 64 year old male with a past medical history notable for hypertension, diabetes, and CVA who is presenting to the Emergency Room for evaluation of nausea and vomiting. Richard Stark reports that 2 nights ago he developed rather sudden onset of nausea and vomiting. Tis persisted until yesterday. At that time, he did present to the Emergency Room. In the Emergency Room, he was also found to have very severely elevated blood pressure. His blood pressure was 222/110 on presentation. He did have a hard time tolerating the blood pressure medicines due to his nausea and vomiting.   Prior to 2 nights ago, Richard Stark was in his usual state of health. He is not having any abdominal pain, any rectal bleeding, melena, dysphagia, or GERD.   In the Emergency Room, he did have a CT scan, which showed some thickening in the distal esophagus. He has never had an upper endoscopy that he is aware of.   PAST MEDICAL HISTORY: 1.  Hypertension. 2.  DM. 3.  CVA.  4.  Constipation.   ALLERGIES: LISINOPRIL.   HOME MEDICATIONS:  1.  Zofran 4 mg q. 8 p.r.n.  2.  Pravastatin 80 mg daily.  3.  Metoprolol 12.5 mg b.i.d.  4.  Lantus 10 units at bedtime.  5.  HCTZ 25 mg twice a day.  6.  Hydralazine 100 mg t.i.d.  7.  Plavix 75 mg daily.  8.  Clonidine 0.3 mg 3 times a day.  9.  Aspirin 81 mg daily.   SOCIAL HISTORY: He is a former smoker. He quit about 1 year ago. He denies any alcohol to me.   FAMILY HISTORY: No family history of GI malignancy, colon cancer, or esophageal cancer that he is aware of.  REVIEW OF SYSTEMS:  CONSTITUTIONAL: No weight gain or weight loss.  No fever or chills. HEENT: No oral lesions or sore  throat. No vision changes. GASTROINTESTINAL: See HPI.  HEME AND LYMPH: No easy bruising or bleeding. CARDIOVASCULAR: No chest pain or dyspnea on exertion. GENITOURINARY: No hematuria. INTEGUMENTARY: No rashes or pruritus PSYCHIATRIC: No depression/anxiety.  ENDOCRINE: No heat/cold intolerance, no hair loss or skin changes. ALLERGIC AND IMMUNOLOGIC: Negative for hives. RESPIRATORY: No cough, no shortness of breath.  MUSCULOSKELETAL: No joint swelling or muscle pain.  PHYSICAL EXAMINATION: VITAL SIGNS: Current temperature 98.5, pulse 96, respirations 16, O2 ST 97% on room air.  GENERAL: Alert and oriented times 4.  No acute distress. Appears stated age. HEENT: Normocephalic/atraumatic. Extraocular movements are intact. Anicteric. NECK: Soft, supple. JVP appears normal. No adenopathy. CHEST: Clear to auscultation. No wheeze or crackle. Respirations unlabored. HEART: Regular. No murmur, rub, or gallop.  Normal S1 and S2. ABDOMEN: Mild diffuse tenderness to palpation. No rebound or guarding. Nondistended.  Normal active bowel sounds in all 4 quadrants.  No organomegaly. No masses EXTREMITIES: No swelling, well perfused. SKIN: No rash or lesion. Skin color, texture, turgor normal. NEUROLOGICAL: Grossly intact. PSYCHIATRIC: Normal tone and affect. MUSCULOSKELETAL: No joint swelling or erythema.   LABORATORY AND DIAGNOSTICS: Sodium 137, potassium 3.2, BUN 15, creatinine 1.35. Liver enzymes are normal. Troponin I 0.08. CK-MB is 8.8. White count is 11.5, hemoglobin 16, hematocrit 47, and platelets 238. Lactic acid 1.5.   Imaging: CT  scan showing thickening of the distal esophagus.   ASSESSMENT AND PLAN:  1.  Two days of nausea and vomiting: The overall presentation does sound consistent with an acute gastroenteritis. Fortunately, he seems to be feeling better at this time and would like to try some clear liquids. The plan at this time, I would hold off on an upper endoscopy. Overall he is  improving and I feel this is consistent with an acute gastroenteritis. I would continue symptomatic management with b.i.d. proton pump inhibitor, Carafate, and antinausea medicine.  2.  Distal esophageal thickening: He is not having any symptoms in terms of dysphagia or reflux. Nonetheless, it would be important to rule out a potential cause of distal esophageal thickening. I will plan to set him up for an upper endoscopy as an outpatient to further investigate this. I do not see any reason to perform this upper endoscopy as an inpatient, however, given his current acute illness.   Thank you for this consultation.  ____________________________ Dow AdolphMatthew Lochlan Grygiel, MD mr:sb D: 03/18/2013 15:49:03 ET T: 03/18/2013 16:02:19 ET JOB#: [id number]  cc: Dow AdolphMatthew Dulce Martian, MD, <Dictator> Kathalene FramesMATTHEW G Kacey Vicuna MD ELECTRONICALLY SIGNED 04/02/2013 13:46

## 2014-05-31 NOTE — Discharge Summary (Signed)
Dates of Admission and Diagnosis:  Date of Admission 18-Mar-2013   Date of Discharge 20-Mar-2013   Admitting Diagnosis malignant htn   Final Diagnosis malignant htn   Discharge Diagnosis 1 uncontrolled htn- due to missing oral meds   2 Esophagitis causing vomiting   3 Htn   4 Diabetes malitus    Chief Complaint/History of Present Illness nausea and Vomiting   Richard Stark is a 64 year old African American male with past medical history of hypertension, hyperlipidemia, diabetes mellitus, insulin-dependent, who comes to the Emergency Department with complaints of intractable vomiting for 1 day. The patient states  previous night, he started to experience nausea and since then has been having multiple episodes of vomiting. He has been constipated for the last 1 week. He had a small bowel movement prior to coming to the Emergency Department. The patient was unable to keep down any of his blood pressure medications. The patient is on multiple blood pressure medications of metoprolol, hydrochlorothiazide, hydralazine and clonidine. The patient's initial blood pressure was 222/110. The patient was given metoprolol with some improvement. Current blood pressure is 169/74. The patient had a CT abdomen and pelvis in the Emergency Department that showed no acute abnormality. He had thickening of the distal esophagus associated with gastroesophageal obstructive disease.     Routine Chem:  09-Feb-15 05:03   Glucose, Serum  207  BUN 15  Creatinine (comp)  1.35  Sodium, Serum 137  Potassium, Serum  3.2  Chloride, Serum 101  CO2, Serum 27  Calcium (Total), Serum 10.0  Anion Gap 9  Osmolality (calc) 281  eGFR (African American) >60  eGFR (Non-African American)  55 (eGFR values <26m/min/1.73 m2 may be an indication of chronic kidney disease (CKD). Calculated eGFR is useful in patients with stable renal function. The eGFR calculation will not be reliable in acutely ill patients when serum  creatinine is changing rapidly. It is not useful in  patients on dialysis. The eGFR calculation may not be applicable to patients at the low and high extremes of body sizes, pregnant women, and vegetarians.)  Result Comment TROPONIN - RESULTS VERIFIED BY REPEAT TESTING.  - PREVIOUS CALL:03/18/13 AT 0236..TPL  Result(s) reported on 18 Mar 2013 at 06:17AM.  Cardiac:  08-Feb-15 15:57   Troponin I 0.05 (0.00-0.05 0.05 ng/mL or less: NEGATIVE  Repeat testing in 3-6 hrs  if clinically indicated. >0.05 ng/mL: POTENTIAL  MYOCARDIAL INJURY. Repeat  testing in 3-6 hrs if  clinically indicated. NOTE: An increase or decrease  of 30% or more on serial  testing suggests a  clinically important change)  09-Feb-15 01:20   Troponin I  0.09 (0.00-0.05 0.05 ng/mL or less: NEGATIVE  Repeat testing in 3-6 hrs  if clinically indicated. >0.05 ng/mL: POTENTIAL  MYOCARDIAL INJURY. Repeat  testing in 3-6 hrs if  clinically indicated. NOTE: An increase or decrease  of 30% or more on serial  testing suggests a  clinically important change)    05:03   CPK-MB, Serum  9.5 (Result(s) reported on 18 Mar 2013 at 06:13AM.)  Troponin I  0.10 (0.00-0.05 0.05 ng/mL or less: NEGATIVE  Repeat testing in 3-6 hrs  if clinically indicated. >0.05 ng/mL: POTENTIAL  MYOCARDIAL INJURY. Repeat  testing in 3-6 hrs if  clinically indicated. NOTE: An increase or decrease  of 30% or more on serial  testing suggests a  clinically important change)    14:01   Troponin I  0.08 (0.00-0.05 0.05 ng/mL or less: NEGATIVE  Repeat testing in 3-6 hrs  if clinically indicated. >0.05 ng/mL: POTENTIAL  MYOCARDIAL INJURY. Repeat  testing in 3-6 hrs if  clinically indicated. NOTE: An increase or decrease  of 30% or more on serial  testing suggests a  clinically important change)  Routine UA:  08-Feb-15 17:57   Color (UA) Yellow  Clarity (UA) Clear  Glucose (UA) >=500  Bilirubin (UA) Negative  Ketones (UA) Negative   Specific Gravity (UA) 1.029  Blood (UA) Negative  pH (UA) 6.0  Protein (UA) 30 mg/dL  Nitrite (UA) Negative  Leukocyte Esterase (UA) Negative (Result(s) reported on 17 Mar 2013 at 06:19PM.)  RBC (UA) 0-5  WBC (UA) 0-5  Bacteria (UA) NEGATIVE  Epithelial Cells (UA) 5-15 / HPF  Result(s) reported on 17 Mar 2013 at 06:19PM.   PERTINENT RADIOLOGY STUDIES: CT:    09-Feb-15 00:27, CT Abdomen and Pelvis With Contrast  CT Abdomen and Pelvis With Contrast   REASON FOR EXAM:    (1) ABD PAIN, LOWER; (2) ABD PAIN  COMMENTS:       PROCEDURE: CT  - CT ABDOMEN / PELVIS  W  - Mar 18 2013 12:27AM     CLINICAL DATA:  Abdominal pain. Lower abdominal pain with nausea and  vomiting.    EXAM:  CT ABDOMEN AND PELVIS WITH CONTRAST    TECHNIQUE:  Multidetector CT imaging of the abdomen and pelvis was performed  using the standard protocol following bolus administration of  intravenous contrast.  CONTRAST:  125 mL Isovue 370.    COMPARISON:  None.    FINDINGS:  LungBases: Scattered areas of scarring and/or atelectasis. No  airspace disease. Coronary artery atherosclerosis is present. If  office based assessment of coronary risk factors has not been  performed, it is now recommended.    Liver:  Normal.    Spleen:  Normal.    Gallbladder:  Normal.  Common bile duct:  Normal.    Pancreas:  Normal.    Adrenal glands: Mild thickening of both adrenal glands, suggesting  hyperplasia.    Kidneys: 25 mm cystic lesion is present in the left upper renal  pole, with mural calcification. On the reconstructed images, the  margins/wall appear irregular and further evaluation is warranted  with MRI of the kidneys. There is normal enhancement and delayed  excretion of contrast.    Stomach: Marked thickening of the distal thoracic esophagus with a  small hiatal hernia. Although most commonly associated with reflux  esophagitis, followup endoscopy is recommended for further  evaluation.    Small  bowel: Normal. No mesenteric adenopathy or small bowel  obstruction. No inflammatory changes.    Colon: Normal appendix. No inflammatory changes or obstruction of  colon.    Pelvic Genitourinary: Urinary bladder appears normal. Impression on  the bladder base from the prostate gland. No free fluid or pelvic  adenopathy. Borderline noted located on the left.    Bones: No aggressive osseous lesions. L5-S1 predominant lumbar  spondylosis. Indeterminate lucency is present in the left side of  the L5 vertebra. Lesion measures 16 mm.    Vasculature: Aortoiliac atherosclerosis. No acute vascular  abnormality.    Body Wall: Fat containing periumbilical hernia. Fat containing left  inguinal hernia.     IMPRESSION:  1. No acute abdominal abnormality.  2. Indeterminate cystic left renal lesion measuring 26 mm. Although  centrally this shows low attenuation, there is irregularity of the  walls and calcification. Followup renal MRI with and without  contrast is recommended. Non-emergent MRI should be deferred  until  patient has been discharged for the acute illness, and canoptimally  cooperate with positioning and breath-holding instructions. This  finding will be reviewed by abdominal radiologist and addendum  issued if necessary.  3. Thickening of the distal esophagus, most commonly associated with  gastroesophageal reflux. Small hiatal hernia. Followup endoscopy  should be considered.  4. Indeterminate radiolucent lesion in the left L5 vertebral body.  In the absence of a history of malignancy, this is probably benign  and may represent hemangioma surrounded by sclerosis from L5-S1  degenerative disease. Three to six-month noncontrast lumbar spine CT  would be reasonable followup to assess for changes.      Electronically Signed    By: Dereck Ligas M.D.    On: 03/18/2013 00:40         Verified By: Melvyn Novas, M.D.,   Hospital Course:  Hospital Course 1.  Nausea and  vomiting. Did not show any small bowel obstruction. had thickening of esophagus- started on PPI and sucralfate- improved, need GI clinic follow ups.    Appreciated Gi consult- likely Gastroenteritis. no work ups at this time. 2.  Hypertension, accelerated, secondary to unable to keep down any of his medications. Keep the patient on labetalol intravenous. needed nicardipin IV drip on admission- for atleast 10 hrs- then under control with oral meds.   Increase metoprolol, added hydralazine. now better controlled. 3.  Diabetes mellitus.adding glipizide on discharge.  4.  Distal esophagitis.Protonix BID and sucralfate. 5.  History of cerebrovascular accident with residual weakness on the right side. continue ASA , Plavix and statin now. 6. Elevated troponin with Accelerated Htn- was strarted on heparin IV driop by Admitting doctor.    Cardiology consult done- as troponin remained stable- drip d/ced- no further workups. Demand ischemia.   Condition on Discharge Stable   DISCHARGE INSTRUCTIONS HOME MEDS:  Medication Reconciliation: Patient's Home Medications at Discharge:     Medication Instructions  clopidogrel 75 mg oral tablet  1 tab(s) orally once a day   pravastatin 80 mg oral tablet  1 tab(s) orally once a day (at bedtime)   clonidine 0.3 mg oral tablet  1 tab(s) orally 3 times a day   metoprolol 25 mg oral tablet  1 tab(s) orally 2 times a day   hydrochlorothiazide 25 mg oral tablet  1 tab(s) orally once a day   hydralazine 25 mg oral tablet  1 tab(s) orally 4 times a day   lantus 100 units/ml subcutaneous solution  10 unit(s) subcutaneous once a day   glipizide 5 mg oral tablet  1 tab(s) orally 2 times a day (before meals)   sucralfate 1 g oral tablet  1 tab(s) orally 4 times a day (before meals and at bedtime)   pantoprazole 40 mg oral delayed release tablet  1 tab(s) orally once a day    PRESCRIPTIONS: PRINTED AND PLACED ON CHART  STOP TAKING THE FOLLOWING MEDICATION(S):    insulin  glargine 100 units/ml subcutaneous solution: 30 unit(s) subcutaneous once a day lantus 100 units/ml subcutaneous solution: 30 unit(s) subcutaneous once a day  Physician's Instructions:  Diet Low Sodium  Carbohydrate Controlled (ADA) Diet   Diet Consistency Regular Consistency   Diet Special Instructions have low salt and low sugar diet- Blood sugar was nicely under control in hospital , may not need high dose insulin to control- if have diet control.  Low salt diet.   Activity Limitations As tolerated   Return to Work Not Applicable   Time  frame for Follow Up Appointment 1-2 weeks   Other Comments follow in GI clinic for further work up for esophagitis.   Electronic Signatures: Vaughan Basta (MD)  (Signed 11-Feb-15 10:38)  Authored: ADMISSION DATE AND DIAGNOSIS, CHIEF COMPLAINT/HPI, PERTINENT LABS, PERTINENT RADIOLOGY STUDIES, HOSPITAL COURSE, DISCHARGE INSTRUCTIONS HOME MEDS, PATIENT INSTRUCTIONS   Last Updated: 11-Feb-15 10:38 by Vaughan Basta (MD)

## 2014-05-31 NOTE — Consult Note (Signed)
Brief Consult Note: Diagnosis: n/v.   Patient was seen by consultant.   Consult note dictated.   Recommend further assessment or treatment.   Comments: 2 days of N/v.    Seems to be much improved when I saw him.  Suspect this is resolving gastroenteritis.   Would recomnend conservative management of this.  Ok to advance to clears now.   Re: esophageal thickening, will setup EGD as outpatient to eval for cause of this.  Most likely artifact or reflux esophagitis but need to r/o malignancy,.  Electronic Signatures: Dow Adolphein, Matthew (MD)  (Signed 09-Feb-15 15:50)  Authored: Brief Consult Note   Last Updated: 09-Feb-15 15:50 by Dow Adolphein, Matthew (MD)

## 2014-05-31 NOTE — Consult Note (Signed)
   Present Illness 64 yo male with history of cva in the past with hypertension, diabetes melitus and hyperlipidemia who was admitted with intractable nausea and vomiting. This has been present for several days. He denies chest pain other than when he is vomiting. He has mild tropoinin elevation to 0.1. EKG does not suggest acute ischemia. CXR unremarkable. He denies chest pain at present   Physical Exam:  GEN well nourished, disheveled   HEENT PERRL, moist oral mucosa   NECK supple   RESP normal resp effort  no use of accessory muscles  rhonchi   CARD Regular rate and rhythm  Normal, S1, S2  No murmur   ABD positive tenderness   EXTR negative cyanosis/clubbing, negative edema   SKIN normal to palpation   PSYCH poor insight   Review of Systems:  Subjective/Chief Complaint nausea and vomiting   General: Fatigue  Weakness   Skin: No Complaints   ENT: No Complaints   Eyes: No Complaints   Neck: No Complaints   Respiratory: Short of breath   Cardiovascular: No Complaints   Gastrointestinal: Heartburn  Nausea   Genitourinary: No Complaints   Vascular: No Complaints   Musculoskeletal: No Complaints   Neurologic: tremulous   Hematologic: No Complaints   Endocrine: No Complaints   Psychiatric: Anxiety   Review of Systems: All other systems were reviewed and found to be negative   Medications/Allergies Reviewed Medications/Allergies reviewed   EKG:  EKG NSR   Abnormal NSSTTW changes    Lisinopril: Angioedema, Chest Tightness   Impression 64 yo male with history of hypertension, hyperlipidemia and diabetes with history of cva now admitted with nausea and vomiting. Borderline tropoinin eelvation which appears to be demand ischemia secondary to nausea and vomiting. Etiology appears to be gastroenteritis. Would hold heparin for now. Conitnue with metoprolol and prophylactic enoxaparin.   Plan 1. Discontinue heparin 2. Continue with enxaparin and  metoprolol 3. Treat gastroenteritis as you are doing. 4 will follow with you 5. further recs pending course.   Electronic Signatures: Dalia HeadingFath, Kenneth A (MD)  (Signed 09-Feb-15 17:15)  Authored: General Aspect/Present Illness, History and Physical Exam, Review of System, EKG , Allergies, Impression/Plan   Last Updated: 09-Feb-15 17:15 by Dalia HeadingFath, Kenneth A (MD)

## 2014-05-31 NOTE — Discharge Summary (Signed)
PATIENT NAME:  Richard Stark, Richard Stark MR#:  [id number] DATE OF BIRTH:  11-29-1950  DATE OF ADMISSION:  03/18/2013 DATE OF DISCHARGE:  03/20/2013  DISCHARGE DIAGNOSES: 1.  Malignant hypertension.  2.  Missed oral doses of antihypertensive medication.  3.  Esophagitis, need outpatient work-up.  4.  Hypertension.  5.  Diabetes.   CONDITION ON DISCHARGE:  Stable.   MEDICATIONS ON DISCHARGE:  1.  Plavix 75 mg once a day.  2.  Pravastatin 80 mg oral once a day.  3.  Clonidine 0.3 mg oral tablet 3 times a day.  4.  Glipizide 5 mg 2 times a day.  5.  Metoprolol 25 mg 2 times a day.  6.  Hydrochlorothiazide 25 mg once a day.  7.  Carafate 1 gram oral tablet 4 times a day before meals and bedtime.  8.  Pantoprazole 40 mg once a day.  9.  Hydralazine 25 mg oral tablet 4 times a day.  10.  Lantus 10 units subcutaneous once a day.  DIET:  Low-sodium, carbohydrate-controlled ADA diet, regular consistency.  Advised to have low salt and low sugar diet.   TIMEFRAME TO FOLLOWUP:  Within 1 to 2 weeks, follow-up in GI clinic for further work-up of esophagitis with Dr. Dow [facility]Matthew [facility].    HISTORY OF PRESENT ILLNESS:  A 64 year old African American male with past medical history of hypertension, hyperlipidemia, diabetes, came to Emergency Room with complaint of intractable vomiting, started to experience nausea, was having multiple episodes of vomiting, has been constipated for one week, small bowel movement.  The patient was unable to keep down even blood pressure medication.  Initially in the ER, blood pressure was found to be 222/110, was given metoprolol and blood pressure in the ER was 169/74 after that.  CT of abdomen and pelvis in the ER did not show any acute abnormality, but had some thickening of distal esophagus.   HOSPITAL COURSE AND STAY:  1.  Nausea and vomiting.  There was no bowel obstruction on the CAT scan, but some esophageal thickening.  GI consult was done on this  admission and he suggested to start on Protonix and sucralfate and the patient had some improvement, so we discharged with follow-up in GI clinic.  2.  Hypertension, accelerated, secondary to unable to keep the medication down because of nausea and vomiting.  We gave intravenous for 1 or 2 doses, but later on after nausea controlled we started on oral medication and blood pressure was better controlled.  3.  Diabetes.  We gave 5 units of Levemir for sliding scale coverage and had better controlling the blood sugar.  Advised the patient not to eat too much sugary diet as he was taking very high dose of Lantus at home and here it was controlled with just a small dose of insulin, so advised to keep a watch on diet and may not need high dose of insulin after discharge. 4.  Distal esophagitis.  He was given Protonix twice daily and sucralfate.  5.  Elevated troponin, accelerated hypertension, started on heparin IV drip by admitting doctor.  Cardiology consult was called in and discontinued heparin drip because of demand ischemia.  The troponin remained stable and blood pressure came under control.  We discharged home without any further instruction from cardiology.   CONSULTATION IN THE HOSPITAL:  GI with Dr. Dow AdolphMatthew Rein.  Cardiology with Dr. Harold HedgeKenneth Fath.     LABORATORY DATA:  WBC 11.5, hemoglobin 16, platelet count  238, BUN 13, creatine 1.39 on admission.  CT abdomen and pelvis with contrast:  No acute abnormality.  Indeterminate cystitis, thickening of the distal esophagus.  Troponin 0.1, remained stable at that level.  Creatinine remained up to 1.34 on discharge.   Total time spent 40 minutes in this discharge.    ____________________________ Hope Pigeon. Elisabeth Pigeon, MD vgv:ea D: 03/24/2013 04:19:44 ET T: 03/24/2013 06:49:43 ET JOB#: [id number]  cc: Hope Pigeon. Elisabeth Pigeon, MD, <Dictator> Dow Adolph, MD Altamese Dilling MD ELECTRONICALLY SIGNED 03/30/2013 8:44

## 2014-05-31 NOTE — H&P (Signed)
PATIENT NAME:  Richard Stark, Richard Stark MR#:  [id number] DATE OF BIRTH:  August 27, 1950  DATE OF ADMISSION:  03/18/2013  PRIMARY CARE PHYSICIAN: [facility].   REFERRING PHYSICIAN: Dr. Lucrezia Europe.   CHIEF COMPLAINT: Nausea and vomiting.   HISTORY OF PRESENT ILLNESS: Richard Stark is a 64 year old African American male with past medical history of hypertension, hyperlipidemia, diabetes mellitus, insulin-dependent, who comes to the Emergency Department with complaints of intractable vomiting since yesterday. The patient states yesterday night, he started to experience nausea and since then has been having multiple episodes of vomiting. He has been constipated for the last 1 week. He had a small bowel movement prior to coming to the Emergency Department. The patient usually takes ; however, did not take at this time. The patient was unable to keep down any of his blood pressure medications. The patient is on multiple blood pressure medications of metoprolol, hydrochlorothiazide, hydralazine and clonidine. The patient's initial blood pressure was 222/110. The patient was given metoprolol with some improvement. Current blood pressure is 169/74. The patient had a CT abdomen and pelvis in the Emergency Department that showed no acute abnormality. He had thickening of the distal esophagus associated with gastroesophageal obstructive disease.   PAST MEDICAL HISTORY:  1.  Hypertension.  2.  Diabetes mellitus, insulin-dependent.  3.  History of stroke.  4.  Constipation.   PAST SURGICAL HISTORY: Tracheostomy.   ALLERGIES: LISINOPRIL.   HOME MEDICATIONS:  1.  Zofran 4 mg every 8 hours as needed.  2.  Pravastatin 80 mg once a day.  3.  Metoprolol 12.5 mg 2 times a day.  4.  Lantus 10 units at bedtime.  5.  Hydrochlorothiazide 25 mg 2 times a day.  6.  Hydralazine 100 mg 3 times a day. 7.  Plavix 75 mgt once a day.  8.  Clonidine 0.3 mg 3 times a day.  9.  Aspirin 81 mg daily.   SOCIAL HISTORY: Quit  smoking 1 year back. Had smoked 1/2 pack a day. Denies drinking alcohol or using illicit drugs. Currently lives with his niece.   FAMILY HISTORY: Hypertension and diabetes mellitus.   REVIEW OF SYSTEMS: CONSTITUTIONAL: Generalized weakness. EYES: No change in vision. ENT: No change in hearing. No sore throat. RESPIRATORY: No cough or shortness of breath. CARDIOVASCULAR: No chest pain or palpitations. GASTROINTESTINAL: Has nausea and vomiting. GENITOURINARY: No dysuria or hematuria. HEMATOLOGIC: No easy bruising or bleeding. SKIN: No rash or lesions. MUSCULOSKELETAL: No joint pains or aches. NEUROLOGIC: Has residual weakness from the stroke from last year on the right side.   PHYSICAL EXAMINATION:  GENERAL: This is a well-built, well-nourished, age-appropriate male laying down in the bed, not in distress.  VITAL SIGNS: Temperature 98.5, pulse 96, blood pressure , respiratory rate 16 and oxygen saturation is 96% on room air.  HEENT: Head normocephalic, atraumatic. Conjunctivae normal. Pupils equal and react to light. Ocular movements are intact. Mucous membranes dry. No pharyngeal erythema.  NECK: Supple. No lymphadenopathy. No JVD. No carotid bruit.  CHEST: Has no focal tenderness.  LUNGS: Bilaterally clear to auscultation.  HEART: S1, S2 regular. No  are heard. ABDOMEN: Bowel sounds present. Soft. Mild tenderness in the epigastric area. No rebound or guarding. Could not appreciate any hepatosplenomegaly.  EXTREMITIES: No pedal edema. Pulses 2+.  NEUROLOGIC: The patient is alert, oriented to place, person and time. Cranial nerves II through XII intact. Motor 5 over 5 in upper and lower extremities.  SKIN: No rash or lesions.  MUSCULOSKELETAL: Good  range of motion in all the extremities.   LABORATORIES: Lactic acid 1.5. Troponin 0.09. CT abdomen and pelvis without contrast: No acute abnormality. Indeterminate left renal lesion of 26 mm. Recommended non-emergent MRI. Thickening of the distal  esophagus. Determinate radiolucent lesion on the left L5 vertebral body. Recommended 3 to 6 month non-contract CT. Complete metabolic panel: BUN 13, creatinine of 1.39 and glucose of 290. All other values are within normal limits. CBC: WBC of 11.5. All others are within normal limits.   ASSESSMENT AND PLAN: Mr. [name] is a 64 year old male, who comes to the Emergency Department with nausea and vomiting.  1.  Nausea and vomiting. Did not show any small bowel obstruction. The patient has somewhat decreased bowel sounds. We will keep the patient n.p.o. If the patient continues to have nausea and vomiting, we will place an NG tube. We will attempt to give an enema.  2.  Hypertension, accelerated, secondary to unable to keep down any of his medications. Keep the patient on labetalol intravenous.  3.  Diabetes mellitus. Seems to be poorly-controlled. Keep the patient on 5 units of insulin while the patient is n.p.o.  4.  Distal esophagitis. We will keep the patient on Protonix intravenous.  5.  History of cerebrovascular accident with residual weakness on the right side. We will hold the aspirin and Plavix for now as the patient is unable to tolerate it.  6.  Keep the patient on deep vein thrombosis prophylaxis with Lovenox.   TIME SPENT: 50 minutes.  ____________________________ Susa GriffinsPadmaja Latayna Ritchie, MD pv:aw D: 03/18/2013 03:26:00 ET T: 03/18/2013 09:09:07 ET JOB#: [id number]  cc: Susa GriffinsPadmaja Avaley Coop, MD, <Dictator> Susa GriffinsPADMAJA Aila Terra MD ELECTRONICALLY SIGNED 04/11/2013 1:18

## 2014-05-31 NOTE — Consult Note (Signed)
Details:   - Gi follow up note.   S: n/v, diarrhea resolved.  No f/c.  Tolerating full diet.   O: vss a and o x 4, nad cta, no w/c rrr, no m/r/g nabs soft, nt  A/P:   1.) Distal esoph thickening on CT.  - protonix 40 mg BID for 8 weeks, then daily - he lneeds an EGD to f/u this up.  I have offered him appt at my clinic.  Since [facility], he would like to have all his care done there.  He will bring a copy of his CT report to his f/u appt and will need to be referred to a gastroenterogist for consideration of EGD.   Electronic Signatures: Dow Adolphein, Matthew (MD)  (Signed 11-Feb-15 14:17)  Authored: Details   Last Updated: 11-Feb-15 14:17 by Dow Adolphein, Matthew (MD)

## 2014-10-10 ENCOUNTER — Emergency Department
Admission: EM | Admit: 2014-10-10 | Discharge: 2014-10-10 | Disposition: A | Discharge status: Home / Self Care | Payer: Medicare Other | Attending: Emergency Medicine | Admitting: Emergency Medicine

## 2014-10-10 ENCOUNTER — Encounter: Payer: Self-pay | Admitting: Emergency Medicine

## 2014-10-10 DIAGNOSIS — I10 Essential (primary) hypertension: Secondary | ICD-10-CM | POA: Insufficient documentation

## 2014-10-10 DIAGNOSIS — E119 Type 2 diabetes mellitus without complications: Secondary | ICD-10-CM | POA: Insufficient documentation

## 2014-10-10 DIAGNOSIS — Y9289 Other specified places as the place of occurrence of the external cause: Secondary | ICD-10-CM | POA: Insufficient documentation

## 2014-10-10 DIAGNOSIS — Y9389 Activity, other specified: Secondary | ICD-10-CM | POA: Diagnosis not present

## 2014-10-10 DIAGNOSIS — Y998 Other external cause status: Secondary | ICD-10-CM | POA: Diagnosis not present

## 2014-10-10 DIAGNOSIS — L089 Local infection of the skin and subcutaneous tissue, unspecified: Secondary | ICD-10-CM | POA: Insufficient documentation

## 2014-10-10 DIAGNOSIS — W57XXXA Bitten or stung by nonvenomous insect and other nonvenomous arthropods, initial encounter: Secondary | ICD-10-CM | POA: Diagnosis not present

## 2014-10-10 DIAGNOSIS — Z720 Tobacco use: Secondary | ICD-10-CM | POA: Diagnosis not present

## 2014-10-10 DIAGNOSIS — S0006XA Insect bite (nonvenomous) of scalp, initial encounter: Secondary | ICD-10-CM | POA: Diagnosis present

## 2014-10-10 HISTORY — DX: Type 2 diabetes mellitus without complications: E11.9

## 2014-10-10 HISTORY — DX: Essential (primary) hypertension: I10

## 2014-10-10 MED ORDER — HYDROCODONE-ACETAMINOPHEN 5-325 MG PO TABS: 1.0000 | ORAL_TABLET | ORAL | Status: DC | PRN

## 2014-10-10 MED ORDER — IBUPROFEN 600 MG PO TABS
600.0000 mg | ORAL_TABLET | Freq: Once | ORAL | Status: AC
  Administered 2014-10-10: 600 mg via ORAL

## 2014-10-10 MED ORDER — IBUPROFEN 600 MG PO TABS
ORAL_TABLET | ORAL | Status: AC
  Administered 2014-10-10: 600 mg via ORAL
  Filled 2014-10-10: qty 1

## 2014-10-10 MED ORDER — SULFAMETHOXAZOLE-TRIMETHOPRIM 800-160 MG PO TABS: 1.0000 | ORAL_TABLET | Freq: Two times a day (BID) | ORAL | Status: DC

## 2014-10-10 NOTE — ED Notes (Signed)
Patient with no complaints at this time. Respirations even and unlabored. Skin warm/dry. Discharge instructions reviewed with patient at this time. Patient given opportunity to voice concerns/ask questions. Patient discharged at this time and left Emergency Department with steady gait.   

## 2014-10-10 NOTE — Discharge Instructions (Signed)
APPLY WARM COMPRESSES TO AREA FREQUENTLY TAKE ALL OF THE ANTIBIOTICS AND TAKE PAIN MEDICATION AS NEEDED
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
## 2014-10-10 NOTE — ED Notes (Signed)
Pt states he was bit on the back of his head 2 days ago, unknown insect, denies itching, states its very sore.
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
## 2020-01-23 ENCOUNTER — Other Ambulatory Visit: Payer: Self-pay | Admitting: Orthopedic Surgery

## 2020-01-23 DIAGNOSIS — M1712 Unilateral primary osteoarthritis, left knee: Secondary | ICD-10-CM

## 2020-01-24 ENCOUNTER — Other Ambulatory Visit: Payer: Self-pay | Admitting: Orthopedic Surgery

## 2020-02-10 ENCOUNTER — Ambulatory Visit
Admission: RE | Admit: 2020-02-10 | Discharge: 2020-02-10 | Disposition: A | Discharge status: Home / Self Care | Payer: Medicare Other | Source: Ambulatory Visit | Attending: Orthopedic Surgery | Admitting: Orthopedic Surgery

## 2020-02-10 ENCOUNTER — Other Ambulatory Visit: Payer: Self-pay

## 2020-02-10 DIAGNOSIS — M1712 Unilateral primary osteoarthritis, left knee: Secondary | ICD-10-CM | POA: Diagnosis present

## 2020-03-24 ENCOUNTER — Other Ambulatory Visit: Payer: Medicare Other

## 2020-03-27 ENCOUNTER — Other Ambulatory Visit: Admission: RE | Admit: 2020-03-27 | Payer: Medicare Other | Source: Ambulatory Visit

## 2020-03-31 ENCOUNTER — Inpatient Hospital Stay: Admit: 2020-03-31 | Payer: Medicare Other | Admitting: Orthopedic Surgery

## 2020-03-31 SURGERY — ARTHROPLASTY, KNEE, TOTAL
Anesthesia: Choice | Site: Knee | Laterality: Left

## 2020-04-03 ENCOUNTER — Other Ambulatory Visit: Payer: Self-pay | Admitting: Orthopedic Surgery

## 2020-04-17 ENCOUNTER — Other Ambulatory Visit: Payer: Self-pay

## 2020-04-17 ENCOUNTER — Encounter
Admission: RE | Admit: 2020-04-17 | Discharge: 2020-04-17 | Disposition: A | Discharge status: Home / Self Care | Payer: Medicare Other | Source: Ambulatory Visit | Attending: Orthopedic Surgery | Admitting: Orthopedic Surgery

## 2020-04-17 DIAGNOSIS — Z01818 Encounter for other preprocedural examination: Secondary | ICD-10-CM | POA: Diagnosis not present

## 2020-04-17 DIAGNOSIS — I493 Ventricular premature depolarization: Secondary | ICD-10-CM | POA: Diagnosis not present

## 2020-04-17 HISTORY — DX: Unspecified osteoarthritis, unspecified site: M19.90

## 2020-04-17 HISTORY — DX: Gastro-esophageal reflux disease without esophagitis: K21.9

## 2020-04-17 LAB — COMPREHENSIVE METABOLIC PANEL
ALT: 21 U/L (ref 0–44)
AST: 20 U/L (ref 15–41)
Albumin: 4.4 g/dL (ref 3.5–5.0)
Alkaline Phosphatase: 53 U/L (ref 38–126)
Anion gap: 10 (ref 5–15)
BUN: 20 mg/dL (ref 8–23)
CO2: 23 mmol/L (ref 22–32)
Calcium: 10.1 mg/dL (ref 8.9–10.3)
Chloride: 101 mmol/L (ref 98–111)
Creatinine, Ser: 1.19 mg/dL (ref 0.61–1.24)
GFR, Estimated: >60 mL/min (ref >60)
Glucose, Bld: 109 mg/dL — ABNORMAL HIGH (ref 70–99)
Potassium: 3.5 mmol/L (ref 3.5–5.1)
Sodium: 134 mmol/L — ABNORMAL LOW (ref 135–145)
Total Bilirubin: 1 mg/dL (ref 0.3–1.2)
Total Protein: 7.6 g/dL (ref 6.5–8.1)

## 2020-04-17 LAB — CBC WITH DIFFERENTIAL/PLATELET
Abs Immature Granulocytes: 0.02 10*3/uL (ref 0.00–0.07)
Basophils Absolute: 0 10*3/uL (ref 0.0–0.1)
Basophils Relative: 1 %
Eosinophils Absolute: 0.1 10*3/uL (ref 0.0–0.5)
Eosinophils Relative: 2 %
HCT: 39.3 % (ref 39.0–52.0)
Hemoglobin: 14.2 g/dL (ref 13.0–17.0)
Immature Granulocytes: 0 %
Lymphocytes Relative: 42 %
Lymphs Abs: 2.2 10*3/uL (ref 0.7–4.0)
MCH: 31.1 pg (ref 26.0–34.0)
MCHC: 36.1 g/dL — ABNORMAL HIGH (ref 30.0–36.0)
MCV: 86 fL (ref 80.0–100.0)
Monocytes Absolute: 0.5 10*3/uL (ref 0.1–1.0)
Monocytes Relative: 9 %
Neutro Abs: 2.4 10*3/uL (ref 1.7–7.7)
Neutrophils Relative %: 46 %
Platelets: 208 10*3/uL (ref 150–400)
RBC: 4.57 MIL/uL (ref 4.22–5.81)
RDW: 13.2 % (ref 11.5–15.5)
WBC: 5.3 10*3/uL (ref 4.0–10.5)
nRBC: 0 % (ref 0.0–0.2)

## 2020-04-17 LAB — URINALYSIS, ROUTINE W REFLEX MICROSCOPIC
Bilirubin Urine: NEGATIVE
Glucose, UA: NEGATIVE mg/dL
Hgb urine dipstick: NEGATIVE
Ketones, ur: NEGATIVE mg/dL
Leukocytes,Ua: NEGATIVE
Nitrite: NEGATIVE
Protein, ur: NEGATIVE mg/dL
Specific Gravity, Urine: 1.023 (ref 1.005–1.030)
pH: 5 (ref 5.0–8.0)

## 2020-04-17 LAB — TYPE AND SCREEN
ABO/RH(D): O NEG
Antibody Screen: NEGATIVE

## 2020-04-17 LAB — SURGICAL PCR SCREEN
MRSA, PCR: NEGATIVE
Staphylococcus aureus: NEGATIVE

## 2020-04-17 NOTE — Patient Instructions (Addendum)
Your procedure is scheduled on:04-28-20 TUESDAY Report to the Registration Desk on the 1st floor of the Medical Mall-Then proceed to the 2nd floor Surgery Desk in the Medical Mall To find out your arrival time, please call [phone] between 1PM - 3PM on:04-27-20 MONDAY  REMEMBER: Instructions that are not followed completely may result in serious medical risk, up to and including death; or upon the discretion of your surgeon and anesthesiologist your surgery may need to be rescheduled.  Do not eat food after midnight the night before surgery.  No gum chewing, lozengers or hard candies.  You may however, drink WATER up to 2 hours before you are scheduled to arrive for your surgery. Do not drink anything within 2 hours of your scheduled arrival time.  Type 1 and Type 2 diabetics should only drink water.  TAKE THESE MEDICATIONS THE MORNING OF SURGERY WITH A SIP OF WATER: -NORVASC (AMLODIPINE) -COREG (CARVEDILOL) -GABAPENTIN (NEURONTIN) -HYDRALAZINE (APRESOLINE)  Stop Metformin 2 days prior to surgery-LAST DOSE 04-25-20 SATURDAY  Take 1/2 of usual insulin dose the night before surgery and none on the morning of surgery-TAKE HALF OF YOUR LANTUS Monday NIGHT BEFORE BED AND NO INSULIN THE MORNING OF SURGERY  Follow recommendations from Cardiologist, Pulmonologist or PCP regarding stopping Aspirin, Coumadin, Plavix, Eliquis, Pradaxa, or Pletal-STOP YOUR PLAVIX 7 DAYS PRIOR TO SURGERY AS INSTRUCTED BY DR MENZ'S OFFICE-LAST DOSE WILL BE ON 04-20-20 MONDAY  One week prior to surgery: Stop Anti-inflammatories (NSAIDS) such as Advil, Aleve, Ibuprofen, Motrin, Naproxen, Naprosyn and Aspirin based products such as Excedrin, Goodys Powder, BC Powder-OK TO TAKE TYLENOL/TRAMADOL IF NEEDED  Stop ANY OVER THE COUNTER supplements until after surgery.  No Alcohol for 24 hours before or after surgery.  No Smoking including e-cigarettes for 24 hours prior to surgery.  No chewable tobacco products for at  least 6 hours prior to surgery.  No nicotine patches on the day of surgery.  Do not use any "recreational" drugs for at least a week prior to your surgery.  Please be advised that the combination of cocaine and anesthesia may have negative outcomes, up to and including death. If you test positive for cocaine, your surgery will be cancelled.  On the morning of surgery brush your teeth with toothpaste and water, you may rinse your mouth with mouthwash if you wish. Do not swallow any toothpaste or mouthwash.  Do not wear jewelry, make-up, hairpins, clips or nail polish.  Do not wear lotions, powders, or perfumes.   Do not shave body from the neck down 48 hours prior to surgery just in case you cut yourself which could leave a site for infection.  Also, freshly shaved skin may become irritated if using the CHG soap.  Contact lenses, hearing aids and dentures may not be worn into surgery.  Do not bring valuables to the hospital. [facility] is not responsible for any missing/lost belongings or valuables.   Use CHG Soap as directed on instruction sheet.  Notify your doctor if there is any change in your medical condition (cold, fever, infection).  Wear comfortable clothing (specific to your surgery type) to the hospital.  Plan for stool softeners for home use; pain medications have a tendency to cause constipation. You can also help prevent constipation by eating foods high in fiber such as fruits and vegetables and drinking plenty of fluids as your diet allows.  After surgery, you can help prevent lung complications by doing breathing exercises.  Take deep breaths and cough every 1-2  hours. Your doctor may order a device called an Incentive Spirometer to help you take deep breaths. When coughing or sneezing, hold a pillow firmly against your incision with both hands. This is called "splinting." Doing this helps protect your incision. It also decreases belly discomfort.  If you are being  admitted to the hospital overnight, leave your suitcase in the car. After surgery it may be brought to your room.  If you are being discharged the day of surgery, you will not be allowed to drive home. You will need a responsible adult (18 years or older) to drive you home and stay with you that night.   If you are taking public transportation, you will need to have a responsible adult (18 years or older) with you. Please confirm with your physician that it is acceptable to use public transportation.   Please call the Pre-admissions Testing Dept. at [phone] if you have any questions about these instructions.  Surgery Visitation Policy:  Patients undergoing a surgery or procedure may have one family member or support person with them as long as that person is not COVID-19 positive or experiencing its symptoms.  That person may remain in the waiting area during the procedure.  Inpatient Visitation:    Visiting hours are 7 a.m. to 8 p.m. Inpatients will be allowed two visitors daily. The visitors may change each day during the patient's stay. No visitors under the age of 16. Any visitor under the age of 75 must be accompanied by an adult. The visitor must pass COVID-19 screenings, use hand sanitizer when entering and exiting the patient's room and wear a mask at all times, including in the patient's room. Patients must also wear a mask when staff or their visitor are in the room. Masking is required regardless of vaccination status.

## 2020-04-24 ENCOUNTER — Other Ambulatory Visit: Payer: Self-pay

## 2020-04-24 ENCOUNTER — Other Ambulatory Visit
Admission: RE | Admit: 2020-04-24 | Discharge: 2020-04-24 | Disposition: A | Discharge status: Home / Self Care | Payer: Medicare Other | Source: Ambulatory Visit | Attending: Orthopedic Surgery | Admitting: Orthopedic Surgery

## 2020-04-24 DIAGNOSIS — Z01812 Encounter for preprocedural laboratory examination: Secondary | ICD-10-CM | POA: Insufficient documentation

## 2020-04-24 DIAGNOSIS — Z20822 Contact with and (suspected) exposure to covid-19: Secondary | ICD-10-CM | POA: Insufficient documentation

## 2020-04-25 LAB — SARS CORONAVIRUS 2 (TAT 6-24 HRS): SARS Coronavirus 2: NEGATIVE

## 2020-04-28 ENCOUNTER — Other Ambulatory Visit: Payer: Self-pay

## 2020-04-28 ENCOUNTER — Inpatient Hospital Stay: Payer: Medicare Other | Admitting: Anesthesiology

## 2020-04-28 ENCOUNTER — Encounter
Admission: RE | Disposition: A | Discharge status: Skilled Nursing Facility | Payer: Self-pay | Source: Home / Self Care | Attending: Orthopedic Surgery

## 2020-04-28 ENCOUNTER — Encounter: Payer: Self-pay | Admitting: Orthopedic Surgery

## 2020-04-28 ENCOUNTER — Inpatient Hospital Stay
Admission: RE | Admit: 2020-04-28 | Discharge: 2020-05-01 | DRG: 470 | Disposition: A | Discharge status: Skilled Nursing Facility | Payer: Medicare Other | Attending: Orthopedic Surgery | Admitting: Orthopedic Surgery

## 2020-04-28 ENCOUNTER — Inpatient Hospital Stay: Payer: Medicare Other

## 2020-04-28 DIAGNOSIS — Z7984 Long term (current) use of oral hypoglycemic drugs: Secondary | ICD-10-CM | POA: Diagnosis not present

## 2020-04-28 DIAGNOSIS — K219 Gastro-esophageal reflux disease without esophagitis: Secondary | ICD-10-CM | POA: Diagnosis present

## 2020-04-28 DIAGNOSIS — Z888 Allergy status to other drugs, medicaments and biological substances status: Secondary | ICD-10-CM | POA: Diagnosis not present

## 2020-04-28 DIAGNOSIS — E785 Hyperlipidemia, unspecified: Secondary | ICD-10-CM | POA: Diagnosis present

## 2020-04-28 DIAGNOSIS — E1122 Type 2 diabetes mellitus with diabetic chronic kidney disease: Secondary | ICD-10-CM | POA: Diagnosis present

## 2020-04-28 DIAGNOSIS — N183 Chronic kidney disease, stage 3 unspecified: Secondary | ICD-10-CM | POA: Diagnosis present

## 2020-04-28 DIAGNOSIS — E876 Hypokalemia: Secondary | ICD-10-CM | POA: Diagnosis present

## 2020-04-28 DIAGNOSIS — Z79899 Other long term (current) drug therapy: Secondary | ICD-10-CM

## 2020-04-28 DIAGNOSIS — I129 Hypertensive chronic kidney disease with stage 1 through stage 4 chronic kidney disease, or unspecified chronic kidney disease: Secondary | ICD-10-CM | POA: Diagnosis present

## 2020-04-28 DIAGNOSIS — Z7982 Long term (current) use of aspirin: Secondary | ICD-10-CM | POA: Diagnosis not present

## 2020-04-28 DIAGNOSIS — Z20822 Contact with and (suspected) exposure to covid-19: Secondary | ICD-10-CM | POA: Diagnosis present

## 2020-04-28 DIAGNOSIS — Z96652 Presence of left artificial knee joint: Secondary | ICD-10-CM

## 2020-04-28 DIAGNOSIS — Z7902 Long term (current) use of antithrombotics/antiplatelets: Secondary | ICD-10-CM

## 2020-04-28 DIAGNOSIS — Z794 Long term (current) use of insulin: Secondary | ICD-10-CM

## 2020-04-28 DIAGNOSIS — M170 Bilateral primary osteoarthritis of knee: Principal | ICD-10-CM | POA: Diagnosis present

## 2020-04-28 DIAGNOSIS — Z8673 Personal history of transient ischemic attack (TIA), and cerebral infarction without residual deficits: Secondary | ICD-10-CM | POA: Diagnosis not present

## 2020-04-28 DIAGNOSIS — G8918 Other acute postprocedural pain: Secondary | ICD-10-CM

## 2020-04-28 HISTORY — PX: TOTAL KNEE ARTHROPLASTY: SHX125

## 2020-04-28 LAB — GLUCOSE, CAPILLARY
Glucose-Capillary: 117 mg/dL — ABNORMAL HIGH (ref 70–99)
Glucose-Capillary: 109 mg/dL — ABNORMAL HIGH (ref 70–99)
Glucose-Capillary: 184 mg/dL — ABNORMAL HIGH (ref 70–99)
Glucose-Capillary: 145 mg/dL — ABNORMAL HIGH (ref 70–99)
Glucose-Capillary: 159 mg/dL — ABNORMAL HIGH (ref 70–99)

## 2020-04-28 LAB — ABO/RH: ABO/RH(D): O NEG

## 2020-04-28 SURGERY — ARTHROPLASTY, KNEE, TOTAL
Anesthesia: Spinal | Site: Knee | Laterality: Left

## 2020-04-28 MED ORDER — BUPIVACAINE HCL (PF) 0.5 % IJ SOLN
INTRAMUSCULAR | Status: DC | PRN
  Administered 2020-04-28: 2.5 mL

## 2020-04-28 MED ORDER — PROPOFOL 10 MG/ML IV BOLUS
INTRAVENOUS | Status: AC
  Filled 2020-04-28: qty 20

## 2020-04-28 MED ORDER — PANTOPRAZOLE SODIUM 40 MG PO TBEC
40.0000 mg | DELAYED_RELEASE_TABLET | Freq: Every day | ORAL | Status: DC
  Administered 2020-04-28 – 2020-04-30 (×3): 40 mg via ORAL
  Filled 2020-04-28 (×3): qty 1

## 2020-04-28 MED ORDER — HYDRALAZINE HCL 25 MG PO TABS
25.0000 mg | ORAL_TABLET | Freq: Three times a day (TID) | ORAL | Status: DC
  Administered 2020-04-28 – 2020-05-01 (×8): 25 mg via ORAL
  Filled 2020-04-28 (×9): qty 1

## 2020-04-28 MED ORDER — ONDANSETRON HCL 4 MG/2ML IJ SOLN: 4.0000 mg | Freq: Once | INTRAMUSCULAR | Status: DC | PRN

## 2020-04-28 MED ORDER — PHENOL 1.4 % MT LIQD
1.0000 | OROMUCOSAL | Status: DC | PRN
  Filled 2020-04-28: qty 177

## 2020-04-28 MED ORDER — ONDANSETRON HCL 4 MG/2ML IJ SOLN: 4.0000 mg | Freq: Four times a day (QID) | INTRAMUSCULAR | Status: DC | PRN

## 2020-04-28 MED ORDER — DOCUSATE SODIUM 100 MG PO CAPS
100.0000 mg | ORAL_CAPSULE | Freq: Two times a day (BID) | ORAL | Status: DC
  Administered 2020-04-28 – 2020-05-01 (×6): 100 mg via ORAL
  Filled 2020-04-28 (×6): qty 1

## 2020-04-28 MED ORDER — FENTANYL CITRATE (PF) 100 MCG/2ML IJ SOLN
INTRAMUSCULAR | Status: AC
  Filled 2020-04-28: qty 2

## 2020-04-28 MED ORDER — LIRAGLUTIDE 18 MG/3ML ~~LOC~~ SOPN: 1.8000 mg | PEN_INJECTOR | SUBCUTANEOUS | Status: DC

## 2020-04-28 MED ORDER — TRAMADOL HCL 50 MG PO TABS
50.0000 mg | ORAL_TABLET | Freq: Four times a day (QID) | ORAL | Status: DC
  Administered 2020-04-28 – 2020-05-01 (×12): 50 mg via ORAL
  Filled 2020-04-28 (×12): qty 1

## 2020-04-28 MED ORDER — INSULIN GLARGINE 100 UNIT/ML ~~LOC~~ SOLN
25.0000 [IU] | Freq: Every day | SUBCUTANEOUS | Status: DC
  Administered 2020-04-28 – 2020-04-30 (×2): 25 [IU] via SUBCUTANEOUS
  Filled 2020-04-28 (×4): qty 0.25

## 2020-04-28 MED ORDER — ACETAMINOPHEN 325 MG PO TABS
325.0000 mg | ORAL_TABLET | Freq: Four times a day (QID) | ORAL | Status: DC | PRN
Start: 2020-04-29 — End: 2020-05-01

## 2020-04-28 MED ORDER — ASPIRIN 81 MG PO CHEW
81.0000 mg | CHEWABLE_TABLET | Freq: Two times a day (BID) | ORAL | Status: DC
  Administered 2020-04-28 – 2020-05-01 (×6): 81 mg via ORAL
  Filled 2020-04-28 (×6): qty 1

## 2020-04-28 MED ORDER — NEOMYCIN-POLYMYXIN B GU 40-200000 IR SOLN
Status: DC | PRN
  Administered 2020-04-28: 2 mL

## 2020-04-28 MED ORDER — AMLODIPINE BESYLATE 10 MG PO TABS
10.0000 mg | ORAL_TABLET | ORAL | Status: DC
  Administered 2020-04-29 – 2020-05-01 (×3): 10 mg via ORAL
  Filled 2020-04-28 (×3): qty 1

## 2020-04-28 MED ORDER — CLOPIDOGREL BISULFATE 75 MG PO TABS
75.0000 mg | ORAL_TABLET | Freq: Every day | ORAL | Status: DC
  Administered 2020-04-29 – 2020-05-01 (×3): 75 mg via ORAL
  Filled 2020-04-28 (×3): qty 1

## 2020-04-28 MED ORDER — MIDAZOLAM HCL 2 MG/2ML IJ SOLN
INTRAMUSCULAR | Status: AC
  Filled 2020-04-28: qty 2

## 2020-04-28 MED ORDER — ONDANSETRON HCL 4 MG/2ML IJ SOLN
INTRAMUSCULAR | Status: AC
  Filled 2020-04-28: qty 2

## 2020-04-28 MED ORDER — ZOLPIDEM TARTRATE 5 MG PO TABS: 5.0000 mg | ORAL_TABLET | Freq: Every evening | ORAL | Status: DC | PRN

## 2020-04-28 MED ORDER — FENTANYL CITRATE (PF) 100 MCG/2ML IJ SOLN
INTRAMUSCULAR | Status: DC | PRN
  Administered 2020-04-28 (×2): 50 ug via INTRAVENOUS

## 2020-04-28 MED ORDER — ONDANSETRON HCL 4 MG/2ML IJ SOLN
INTRAMUSCULAR | Status: DC | PRN
  Administered 2020-04-28: 4 mg via INTRAVENOUS

## 2020-04-28 MED ORDER — OXYCODONE HCL 5 MG PO TABS
5.0000 mg | ORAL_TABLET | ORAL | Status: DC | PRN
  Administered 2020-04-28 (×2): 5 mg via ORAL
  Administered 2020-04-29: 10 mg via ORAL
  Administered 2020-04-30 (×2): 5 mg via ORAL
  Filled 2020-04-28 (×2): qty 1
  Filled 2020-04-28: qty 2
  Filled 2020-04-28: qty 1
  Filled 2020-04-28: qty 2
  Filled 2020-04-28: qty 1

## 2020-04-28 MED ORDER — MENTHOL 3 MG MT LOZG
1.0000 | LOZENGE | OROMUCOSAL | Status: DC | PRN
  Filled 2020-04-28: qty 9

## 2020-04-28 MED ORDER — METOCLOPRAMIDE HCL 10 MG PO TABS
5.0000 mg | ORAL_TABLET | Freq: Three times a day (TID) | ORAL | Status: DC | PRN
Start: 2020-04-28 — End: 2020-05-01

## 2020-04-28 MED ORDER — MIDAZOLAM HCL 5 MG/5ML IJ SOLN
INTRAMUSCULAR | Status: DC | PRN
  Administered 2020-04-28: 2 mg via INTRAVENOUS

## 2020-04-28 MED ORDER — BUPIVACAINE HCL (PF) 0.5 % IJ SOLN
INTRAMUSCULAR | Status: AC
  Filled 2020-04-28: qty 30

## 2020-04-28 MED ORDER — METFORMIN HCL 500 MG PO TABS
1000.0000 mg | ORAL_TABLET | Freq: Two times a day (BID) | ORAL | Status: DC
  Administered 2020-04-28 – 2020-05-01 (×6): 1000 mg via ORAL
  Filled 2020-04-28 (×6): qty 2

## 2020-04-28 MED ORDER — MAGNESIUM CITRATE PO SOLN
1.0000 | Freq: Once | ORAL | Status: DC | PRN
  Filled 2020-04-28: qty 296

## 2020-04-28 MED ORDER — ACETAMINOPHEN 10 MG/ML IV SOLN
INTRAVENOUS | Status: DC | PRN
  Administered 2020-04-28: 1000 mg via INTRAVENOUS

## 2020-04-28 MED ORDER — ONDANSETRON HCL 4 MG PO TABS: 4.0000 mg | ORAL_TABLET | Freq: Four times a day (QID) | ORAL | Status: DC | PRN

## 2020-04-28 MED ORDER — PROPOFOL 500 MG/50ML IV EMUL
INTRAVENOUS | Status: DC | PRN
  Administered 2020-04-28: 125 ug/kg/min via INTRAVENOUS

## 2020-04-28 MED ORDER — PHENYLEPHRINE HCL (PRESSORS) 10 MG/ML IV SOLN
INTRAVENOUS | Status: AC
  Filled 2020-04-28: qty 1

## 2020-04-28 MED ORDER — METOCLOPRAMIDE HCL 5 MG/ML IJ SOLN
5.0000 mg | Freq: Three times a day (TID) | INTRAMUSCULAR | Status: DC | PRN
Start: 2020-04-28 — End: 2020-05-01

## 2020-04-28 MED ORDER — CEFAZOLIN SODIUM-DEXTROSE 2-4 GM/100ML-% IV SOLN
2.0000 g | INTRAVENOUS | Status: AC
  Administered 2020-04-28: 2 g via INTRAVENOUS

## 2020-04-28 MED ORDER — CEFAZOLIN SODIUM-DEXTROSE 2-4 GM/100ML-% IV SOLN
INTRAVENOUS | Status: AC
  Administered 2020-04-28: 2000 mg
  Filled 2020-04-28: qty 100

## 2020-04-28 MED ORDER — CHLORHEXIDINE GLUCONATE 0.12 % MT SOLN
OROMUCOSAL | Status: AC
  Administered 2020-04-28: 15 mL via OROMUCOSAL
  Filled 2020-04-28: qty 15

## 2020-04-28 MED ORDER — SODIUM CHLORIDE 0.9 % IV SOLN
INTRAVENOUS | Status: DC | PRN
  Administered 2020-04-28: 60 mL

## 2020-04-28 MED ORDER — METHOCARBAMOL 500 MG PO TABS
500.0000 mg | ORAL_TABLET | Freq: Four times a day (QID) | ORAL | Status: DC | PRN
  Administered 2020-04-28 – 2020-04-29 (×3): 500 mg via ORAL
  Filled 2020-04-28 (×3): qty 1

## 2020-04-28 MED ORDER — DIPHENHYDRAMINE HCL 12.5 MG/5ML PO ELIX: 12.5000 mg | ORAL_SOLUTION | ORAL | Status: DC | PRN

## 2020-04-28 MED ORDER — HYDROMORPHONE HCL 1 MG/ML IJ SOLN
0.5000 mg | INTRAMUSCULAR | Status: DC | PRN
  Administered 2020-04-28: 0.5 mg via INTRAVENOUS
  Administered 2020-04-29: 1 mg via INTRAVENOUS
  Filled 2020-04-28 (×2): qty 1

## 2020-04-28 MED ORDER — METHOCARBAMOL 1000 MG/10ML IJ SOLN
500.0000 mg | Freq: Four times a day (QID) | INTRAVENOUS | Status: DC | PRN
  Filled 2020-04-28: qty 5

## 2020-04-28 MED ORDER — SODIUM CHLORIDE 0.9 % IV SOLN: INTRAVENOUS | Status: DC

## 2020-04-28 MED ORDER — FAMOTIDINE 20 MG PO TABS
ORAL_TABLET | ORAL | Status: AC
  Administered 2020-04-28: 20 mg via ORAL
  Filled 2020-04-28: qty 1

## 2020-04-28 MED ORDER — ACETAMINOPHEN 500 MG PO TABS
1000.0000 mg | ORAL_TABLET | Freq: Four times a day (QID) | ORAL | Status: AC
  Administered 2020-04-28 – 2020-04-29 (×4): 1000 mg via ORAL
  Filled 2020-04-28 (×4): qty 2

## 2020-04-28 MED ORDER — CARVEDILOL 25 MG PO TABS
25.0000 mg | ORAL_TABLET | Freq: Two times a day (BID) | ORAL | Status: DC
  Administered 2020-04-28 – 2020-05-01 (×6): 25 mg via ORAL
  Filled 2020-04-28 (×6): qty 1

## 2020-04-28 MED ORDER — SODIUM CHLORIDE 0.9 % IV SOLN
INTRAVENOUS | Status: DC | PRN
  Administered 2020-04-28: 30 ug/min via INTRAVENOUS

## 2020-04-28 MED ORDER — ALUM & MAG HYDROXIDE-SIMETH 200-200-20 MG/5ML PO SUSP: 30.0000 mL | ORAL | Status: DC | PRN

## 2020-04-28 MED ORDER — ACETAMINOPHEN 10 MG/ML IV SOLN
INTRAVENOUS | Status: AC
  Filled 2020-04-28: qty 100

## 2020-04-28 MED ORDER — ATORVASTATIN CALCIUM 20 MG PO TABS
40.0000 mg | ORAL_TABLET | Freq: Every day | ORAL | Status: DC
  Administered 2020-04-28 – 2020-04-30 (×3): 40 mg via ORAL
  Filled 2020-04-28 (×3): qty 2

## 2020-04-28 MED ORDER — CHLORTHALIDONE 25 MG PO TABS
25.0000 mg | ORAL_TABLET | Freq: Every day | ORAL | Status: DC
  Administered 2020-04-29 – 2020-05-01 (×3): 25 mg via ORAL
  Filled 2020-04-28 (×4): qty 1

## 2020-04-28 MED ORDER — ORAL CARE MOUTH RINSE: 15.0000 mL | Freq: Once | OROMUCOSAL | Status: AC

## 2020-04-28 MED ORDER — BUPIVACAINE-EPINEPHRINE (PF) 0.25% -1:200000 IJ SOLN
INTRAMUSCULAR | Status: DC | PRN
  Administered 2020-04-28: 30 mL

## 2020-04-28 MED ORDER — FAMOTIDINE 20 MG PO TABS: 20.0000 mg | ORAL_TABLET | Freq: Once | ORAL | Status: AC

## 2020-04-28 MED ORDER — FENTANYL CITRATE (PF) 100 MCG/2ML IJ SOLN: 25.0000 ug | INTRAMUSCULAR | Status: DC | PRN

## 2020-04-28 MED ORDER — OXYCODONE HCL 5 MG PO TABS
10.0000 mg | ORAL_TABLET | ORAL | Status: DC | PRN
Start: 2020-04-28 — End: 2020-05-01
  Administered 2020-04-28 – 2020-05-01 (×4): 10 mg via ORAL
  Filled 2020-04-28 (×3): qty 2

## 2020-04-28 MED ORDER — MAGNESIUM HYDROXIDE 400 MG/5ML PO SUSP
30.0000 mL | Freq: Every day | ORAL | Status: DC | PRN
  Administered 2020-04-30: 30 mL via ORAL
  Filled 2020-04-28: qty 30

## 2020-04-28 MED ORDER — BISACODYL 10 MG RE SUPP
10.0000 mg | Freq: Every day | RECTAL | Status: DC | PRN
Start: 2020-04-28 — End: 2020-05-01

## 2020-04-28 MED ORDER — MORPHINE SULFATE (PF) 10 MG/ML IV SOLN
INTRAVENOUS | Status: DC | PRN
  Administered 2020-04-28: 10 mg

## 2020-04-28 MED ORDER — GABAPENTIN 300 MG PO CAPS
300.0000 mg | ORAL_CAPSULE | Freq: Two times a day (BID) | ORAL | Status: DC
  Administered 2020-04-28 – 2020-05-01 (×6): 300 mg via ORAL
  Filled 2020-04-28 (×6): qty 1

## 2020-04-28 MED ORDER — PROPOFOL 500 MG/50ML IV EMUL
INTRAVENOUS | Status: AC
  Filled 2020-04-28: qty 50

## 2020-04-28 MED ORDER — CHLORHEXIDINE GLUCONATE 0.12 % MT SOLN: 15.0000 mL | Freq: Once | OROMUCOSAL | Status: AC

## 2020-04-28 MED ORDER — CEFAZOLIN SODIUM-DEXTROSE 2-4 GM/100ML-% IV SOLN
2.0000 g | Freq: Four times a day (QID) | INTRAVENOUS | Status: AC
  Administered 2020-04-28 (×2): 2 g via INTRAVENOUS
  Filled 2020-04-28 (×2): qty 100

## 2020-04-28 MED ORDER — INSULIN ASPART 100 UNIT/ML ~~LOC~~ SOLN
0.0000 [IU] | Freq: Three times a day (TID) | SUBCUTANEOUS | Status: DC
  Administered 2020-04-28: 2 [IU] via SUBCUTANEOUS
  Administered 2020-04-28: 3 [IU] via SUBCUTANEOUS
  Administered 2020-04-29 – 2020-04-30 (×4): 2 [IU] via SUBCUTANEOUS
  Administered 2020-04-30: 3 [IU] via SUBCUTANEOUS
  Administered 2020-04-30: 2 [IU] via SUBCUTANEOUS
  Filled 2020-04-28 (×8): qty 1

## 2020-04-28 SURGICAL SUPPLY — 73 items
BLADE SAGITTAL 25.0X1.19X90 (BLADE) ×2 IMPLANT
BLADE SAW 90X13X1.19 OSCILLAT (BLADE) ×2 IMPLANT
BLOCK CUTTING TIBIAL 4 LT CT (MISCELLANEOUS) ×2 IMPLANT
BLOCK CUTTING TIBIAL 4 MED (MISCELLANEOUS) ×2 IMPLANT
BNDG ELASTIC 6X5.8 VLCR STR LF (GAUZE/BANDAGES/DRESSINGS) ×2 IMPLANT
CANISTER SUCT 1200ML W/VALVE (MISCELLANEOUS) ×2 IMPLANT
CANISTER SUCT 3000ML PPV (MISCELLANEOUS) ×4 IMPLANT
CANISTER WOUND CARE 500ML ATS (WOUND CARE) ×2 IMPLANT
CEMENT HV SMART SET (Cement) ×4 IMPLANT
CHLORAPREP W/TINT 26 (MISCELLANEOUS) ×4 IMPLANT
COOLER POLAR GLACIER W/PUMP (MISCELLANEOUS) ×2 IMPLANT
COVER WAND RF STERILE (DRAPES) ×2 IMPLANT
CUFF TOURN SGL QUICK 24 (TOURNIQUET CUFF)
CUFF TOURN SGL QUICK 30 (TOURNIQUET CUFF)
CUFF TRNQT CYL 24X4X16.5-23 (TOURNIQUET CUFF) IMPLANT
CUFF TRNQT CYL 30X4X21-28X (TOURNIQUET CUFF) IMPLANT
DRAPE 3/4 80X56 (DRAPES) ×4 IMPLANT
DRSG MEPILEX SACRM 8.7X9.8 (GAUZE/BANDAGES/DRESSINGS) ×2 IMPLANT
ELECT CAUTERY BLADE 6.4 (BLADE) ×2 IMPLANT
ELECT REM PT RETURN 9FT ADLT (ELECTROSURGICAL) ×2
ELECTRODE REM PT RTRN 9FT ADLT (ELECTROSURGICAL) ×1 IMPLANT
FEM COMP 5 LT CEMENT 02120005L (Femur) ×2 IMPLANT
FEMUR BONE MODEL 4.9010 MEDACT (MISCELLANEOUS) ×2 IMPLANT
GAUZE SPONGE 4X4 12PLY STRL (GAUZE/BANDAGES/DRESSINGS) ×2 IMPLANT
GAUZE XEROFORM 1X8 LF (GAUZE/BANDAGES/DRESSINGS) ×2 IMPLANT
GLOVE SURG ORTHO LTX SZ8 (GLOVE) ×2 IMPLANT
GLOVE SURG SYN 9.0  PF PI (GLOVE) ×1
GLOVE SURG SYN 9.0 PF PI (GLOVE) ×1 IMPLANT
GLOVE SURG UNDER LTX SZ8 (GLOVE) ×2 IMPLANT
GLOVE SURG UNDER POLY LF SZ9 (GLOVE) ×2 IMPLANT
GOWN SRG 2XL LVL 4 RGLN SLV (GOWNS) ×1 IMPLANT
GOWN STRL NON-REIN 2XL LVL4 (GOWNS) ×1
GOWN STRL REUS W/ TWL LRG LVL3 (GOWN DISPOSABLE) ×1 IMPLANT
GOWN STRL REUS W/ TWL XL LVL3 (GOWN DISPOSABLE) ×1 IMPLANT
GOWN STRL REUS W/TWL LRG LVL3 (GOWN DISPOSABLE) ×1
GOWN STRL REUS W/TWL XL LVL3 (GOWN DISPOSABLE) ×1
HOLDER FOLEY CATH W/STRAP (MISCELLANEOUS) ×2 IMPLANT
HOOD PEEL AWAY FLYTE STAYCOOL (MISCELLANEOUS) ×4 IMPLANT
IRRIGATION SURGIPHOR STRL (IV SOLUTION) IMPLANT
KIT PREVENA INCISION MGT20CM45 (CANNISTER) ×2 IMPLANT
KIT TURNOVER KIT A (KITS) ×2 IMPLANT
MANIFOLD NEPTUNE II (INSTRUMENTS) ×2 IMPLANT
NDL SAFETY ECLIPSE 18X1.5 (NEEDLE) ×1 IMPLANT
NEEDLE HYPO 18GX1.5 SHARP (NEEDLE) ×1
NEEDLE SPNL 18GX3.5 QUINCKE PK (NEEDLE) ×2 IMPLANT
NEEDLE SPNL 20GX3.5 QUINCKE YW (NEEDLE) ×2 IMPLANT
NS IRRIG 1000ML POUR BTL (IV SOLUTION) ×2 IMPLANT
PACK TOTAL KNEE (MISCELLANEOUS) ×2 IMPLANT
PAD WRAPON POLAR KNEE (MISCELLANEOUS) ×1 IMPLANT
PATELLA RESURFACING MEDACTA SZ (Bone Implant) ×2 IMPLANT
PENCIL SMOKE EVACUATOR COATED (MISCELLANEOUS) ×2 IMPLANT
PULSAVAC PLUS IRRIG FAN TIP (DISPOSABLE) ×2
SCALPEL PROTECTED #10 DISP (BLADE) ×4 IMPLANT
SOL .9 NS 3000ML IRR  AL (IV SOLUTION) ×1
SOL .9 NS 3000ML IRR UROMATIC (IV SOLUTION) ×1 IMPLANT
STAPLER SKIN PROX 35W (STAPLE) ×2 IMPLANT
STEM EXTENSION 11MMX30MM (Stem) ×2 IMPLANT
SUCTION FRAZIER HANDLE 10FR (MISCELLANEOUS) ×1
SUCTION TUBE FRAZIER 10FR DISP (MISCELLANEOUS) ×1 IMPLANT
SUT DVC 2 QUILL PDO  T11 36X36 (SUTURE) ×1
SUT DVC 2 QUILL PDO T11 36X36 (SUTURE) ×1 IMPLANT
SUT ETHIBOND 2 V 37 (SUTURE) IMPLANT
SUT V-LOC 90 ABS DVC 3-0 CL (SUTURE) ×2 IMPLANT
SYR 20ML LL LF (SYRINGE) ×2 IMPLANT
SYR 50ML LL SCALE MARK (SYRINGE) ×4 IMPLANT
TIBIAL BONE MODEL LEFT (MISCELLANEOUS) ×2 IMPLANT
TIBIAL INSERT SZ4 LT 02120410F (Insert) ×2 IMPLANT
TIBIAL TRAY FIXED MEDACTA 0207 (Bone Implant) ×2 IMPLANT
TIP FAN IRRIG PULSAVAC PLUS (DISPOSABLE) ×1 IMPLANT
TOWEL OR 17X26 4PK STRL BLUE (TOWEL DISPOSABLE) ×2 IMPLANT
TOWER CARTRIDGE SMART MIX (DISPOSABLE) ×2 IMPLANT
TRAY FOLEY MTR SLVR 16FR STAT (SET/KITS/TRAYS/PACK) ×2 IMPLANT
WRAPON POLAR PAD KNEE (MISCELLANEOUS) ×2

## 2020-04-28 NOTE — Anesthesia Preprocedure Evaluation (Signed)
Anesthesia Evaluation  Patient identified by MRN, date of birth, ID band Patient awake    Reviewed: Allergy & Precautions, NPO status , Patient's Chart, lab work & pertinent test results  History of Anesthesia Complications Negative for: history of anesthetic complications  Airway Mallampati: II  TM Distance: >3 FB Neck ROM: Full    Dental  (+) Poor Dentition, Missing   Pulmonary neg sleep apnea, neg COPD, Current Smoker and Patient abstained from smoking.,    breath sounds clear to auscultation- rhonchi (-) wheezing      Cardiovascular hypertension, Pt. on medications (-) CAD, (-) Past MI, (-) Cardiac Stents and (-) CABG  Rhythm:Regular Rate:Normal - Systolic murmurs and - Diastolic murmurs    Neuro/Psych neg Seizures CVA, Residual Symptoms negative psych ROS   GI/Hepatic Neg liver ROS, GERD  ,  Endo/Other  diabetes, Insulin Dependent  Renal/GU negative Renal ROS     Musculoskeletal  (+) Arthritis ,   Abdominal (+) + obese,   Peds  Hematology negative hematology ROS (+)   Anesthesia Other Findings Past Medical History: No date: Arthritis No date: Diabetes mellitus without complication (HCC) No date: GERD (gastroesophageal reflux disease)     Comment:  OCC NO MEDS No date: Hypertension 2008: Stroke (HCC)     Comment:  RIGHT ARM WEAKNESS   Reproductive/Obstetrics                             Anesthesia Physical Anesthesia Plan  ASA: III  Anesthesia Plan: Spinal   Post-op Pain Management:    Induction:   PONV Risk Score and Plan: 0 and Ondansetron and Propofol infusion  Airway Management Planned: Natural Airway  Additional Equipment:   Intra-op Plan:   Post-operative Plan:   Informed Consent: I have reviewed the patients History and Physical, chart, labs and discussed the procedure including the risks, benefits and alternatives for the proposed anesthesia with the patient  or authorized representative who has indicated his/her understanding and acceptance.     Dental advisory given  Plan Discussed with: CRNA and Anesthesiologist  Anesthesia Plan Comments:         Anesthesia Quick Evaluation

## 2020-04-28 NOTE — Progress Notes (Signed)
Patient was able to transfer to the bathroom with RW x2 assist without issues. Patient now back in bed.

## 2020-04-28 NOTE — Anesthesia Procedure Notes (Signed)
Procedure Name: MAC Date/Time: 04/28/2020 7:40 AM Performed by: Martie Round, RN Pre-anesthesia Checklist: Patient identified, Emergency Drugs available, Suction available and Patient being monitored Oxygen Delivery Method: Simple face mask

## 2020-04-28 NOTE — Anesthesia Procedure Notes (Signed)
Spinal  Patient location during procedure: OR Start time: 04/28/2020 7:35 AM Reason for block: surgical anesthesia Staffing Resident/CRNA: Logan, Benjamin, CRNA Preanesthetic Checklist Completed: patient identified, IV checked, site marked, risks and benefits discussed, surgical consent, monitors and equipment checked, pre-op evaluation and timeout performed Spinal Block Patient position: sitting Prep: Betadine Patient monitoring: heart rate, continuous pulse ox, blood pressure and cardiac monitor Approach: midline Location: L3-4 Injection technique: single-shot Needle Needle type: Introducer and Pencan  Needle gauge: 24 G Needle length: 9 cm Assessment Sensory level: T10 Events: CSF return Additional Notes Negative paresthesia. Negative blood return. Positive free-flowing CSF. Expiration date of kit checked and confirmed. Patient tolerated procedure well, without complications.       

## 2020-04-28 NOTE — Transfer of Care (Signed)
Immediate Anesthesia Transfer of Care Note  Patient: Richard Stark  Procedure(s) Performed: TOTAL KNEE ARTHROPLASTY (Left Knee)  Patient Location: PACU  Anesthesia Type:Spinal  Level of Consciousness: awake and alert   Airway & Oxygen Therapy: Patient Spontanous Breathing and Patient connected to face mask oxygen  Post-op Assessment: Report given to RN and Post -op Vital signs reviewed and stable  Post vital signs: Reviewed  Last Vitals:  Vitals Value Taken Time  BP    Temp    Pulse 68 04/28/20 0943  Resp 10 04/28/20 0943  SpO2 99 % 04/28/20 0943  Vitals shown include unvalidated device data.  Last Pain:  Vitals:   04/28/20 0616  TempSrc: Temporal         Complications: No complications documented.

## 2020-04-28 NOTE — Progress Notes (Signed)
OT Cancellation Note  Patient Details Name: EDMUND HOLCOMB MRN: [id number] DOB: March 03, 1950   Cancelled Treatment:    Reason Eval/Treat Not Completed: Other (comment). OT order received and chart reviewed. Pt currently working with PT. OT will re-attempt at next available time.   Jackquline Denmark, MS, OTR/L , CBIS ascom [phone]  04/28/20, 4:32 PM   04/28/2020, 4:30 PM

## 2020-04-28 NOTE — H&P (Signed)
Regino Bellow, PA - 04/15/2020 10:00 AM EST Formatting of this note is different from the original. Images from the original note were not included. Chief Complaint Chief Complaint  Patient presents with  . Left Knee - Pre-op Exam  . Pre-op Exam  Left TKA 04/28/20 Rudene Christians   Reason for Visit Ronny Korff Richard Stark is a 70 y.o. who presents today for a history and physical. He is to undergo a left total knee arthroplasty on 04/28/2020. Last seen in the clinic on 01/22/2020. There have been no change in his condition since that date. However he was seen at [facility] on 04/06/2020 for the right knee and received a cortisone injection. He was wanting more than the left knee but they would not do this secondary to possibility of increasing chance of infection since he is having surgery on that knee.  Patient has had left knee pain for 5 years. He has been evaluated at [facility], x-ray showing severe degenerative changes throughout the left knee. He has had cortisone shots and gel shots with no improvement of knee pain. He was trying to get scheduled for knee arthroplasty at [facility] but was told that it would be at least May 2022 before he could have his surgery. Pain is severe and debilitating his quality of life and activities of daily living. He is having a hard time walking. He is ambulating with a cane. He also has significant right knee osteoarthritis but left knee is much more severe than the right. Patient describes left knee medial joint line pain worse with any type of weightbearing activity even at rest he'll have aching throbbing pain. His knee will buckle and give way. He wears knee sleeves for support. He only takes Tylenol for pain.  Past Medical History Past Medical History:  Diagnosis Date  . Chronic kidney disease 05/07/2009  . CVA (cerebral vascular accident) (CMS-HCC) 05/15/2012  Last Assessment & Plan: Formatting of this note might be different from the original. Patient with hand pain s/p  stroke. Has seen [facility] in the past. Will refer back to them for help with exercises and stretches that may help his discomfort. -- Instructed patient that we do not want to increase dose of Tramadol any further -- Can take 2 Tylenol tablets (1000 mg) 3 times a day  . Diabetes mellitus without complication (CMS-HCC)  . 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 History of stroke  . Hyperlipemia 02/17/2012  . Hyperlipidemia  . Hypertension  . Hypertension, benign 05/07/2009  Last Assessment & Plan: Formatting of this note might be different from the original. BP 141/71 today. Continue current regimen.  . Left shoulder pain 01/24/2017  . Primary localized osteoarthritis of knees, bilateral 07/07/2010  . Tobacco abuse 11/24/2015  . Type 2 diabetes mellitus, with long-term current use of insulin (CMS-HCC) 05/07/2009  Last Assessment & Plan: Formatting of this note might be different from the original. A1C 6.0 today. Excellent control. -- Continue Lantus 30 u nightly -- Continue metformin   Past Surgical History No past surgical history on file.  Past Family History Family History  Family history unknown: Yes   Medications Current Outpatient Medications Ordered in Epic  Medication Sig Dispense Refill  . acetaminophen (TYLENOL) 500 MG tablet Take 1 tablet by mouth every 8 (eight) hours as needed  . amLODIPine (NORVASC) 10 MG tablet Take 1 tablet by mouth once daily  . atorvastatin (LIPITOR) 40 MG tablet Take 40 mg by mouth once daily  . blood  glucose diagnostic (ACCU-CHEK AVIVA PLUS TEST STRP) test strip CHECK BLOOD SUGAR 3 TIMES DAILY  . carvediloL (COREG) 25 MG tablet Take by mouth  . chlorthalidone 25 MG tablet Take 25 mg by mouth every morning  . clopidogreL (PLAVIX) 75 mg tablet Take by mouth  . flash glucose sensor (FREESTYLE LIBRE 14 DAY SENSOR) kit by Other route every ten (10) days.  Marland Kitchen gabapentin (NEURONTIN) 300 MG capsule Take $RemoveBefo'300mg'scUunzQpcBc$  (1 tablet) three times  daily.  . insulin GLARGINE (LANTUS SOLOSTAR U-100 INSULIN) pen injector (concentration 100 units/mL) Inject subcutaneously  . lidocaine (LIDODERM) 5 % patch Apply to affected area for 12 hours only each day (then remove patch)  . liraglutide (VICTOZA) 0.6 mg/0.1 mL (18 mg/3 mL) pen injector Inject subcutaneously  . metFORMIN (GLUCOPHAGE) 1000 MG tablet Take by mouth  . nicotine (NICODERM CQ) 7 mg/24 hr patch Place onto the skin  . traMADoL (ULTRAM) 50 mg tablet Take 1 tablet (50 mg total) by mouth every 4 (four) hours as needed 30 tablet 0  . hydrALAZINE (APRESOLINE) 25 MG tablet Take by mouth   No current Epic-ordered facility-administered medications on file.   Allergies Allergies  Allergen Reactions  . Lisinopril Anaphylaxis and Shortness Of Breath  Throat closes had to be tracheid.    Review of Systems A comprehensive 14 point ROS was performed, reviewed, and the pertinent orthopaedic findings are documented in the HPI.  Exam BP 130/82  Ht 167.6 cm ($RemoveB'5\' 6"'TbrRJZUm$ )  Wt 92.9 kg (204 lb 12.9 oz)  BMI 33.06 kg/m   General: Well-developed well-nourished male seen in no acute distress.   HEENT: Atraumatic,normocephalic. Pupils are equal and reactive to light. Oropharynx is clear with moist mucosa  Lungs: Clear to auscultation bilaterally   Cardiovascular: Regular rate and rhythm. Normal S1, S2. No murmurs. No appreciable gallops or rubs. Peripheral pulses are palpable.  Abdomen: Soft, non-tender, nondistended. Bowel sounds present  Extremity: Left lower Extremities: Examination of the left lower extremity reveals no bony abnormality, no edema, no effusion and no ecchymosis. There is varus deformity that is partially passively correctable. The patient is non-tender along the lateral joint line, and is tender along the medial joint line. The patient has full knee flexion and extension. There is no discomfort with range of motion exercises. There is no retropatellar discomfort. The  patient has a negative patella stretch test. The patient has a negative varus stress test and a negative valgus stress test, in looking for stability. The patient has a negative Lachman's test.  Neurological:  The patient is alert and oriented Sensation to light touch appears to be intact and within normal limits Gross motor strength appeared to be equal to 5/5  Vascular :  Peripheral pulses felt to be palpable. Capillary refill appears to be intact and within normal limits  X-ray  AP lateral sunrise views the left knee taken on 01/22/2020 showed complete loss of joint space in the medial compartment with varus deformity noted. There is moderate to severe subchondral changes along the medial tibial plateau and medial femoral condyle. There is mild joint space narrowing in the lateral compartment with irregularity along the lateral femoral condyle. Subchondral sclerosing along the patellofemoral compartment compartment with mild spurring along the medial patella facet.  Impression  1. Degenerative arthrosis left knee  Plan   1. Patient is to discontinue his Plavix 7 days prior to surgery 2. Return to clinic 2 weeks postop  This note was generated in part with voice recognition software and I  apologize for any typographical errors that were not detected and corrected   Watt Climes PA  Electronically signed by Regino Bellow, PA at 04/15/2020 10:16 AM EST  Back to top of Progress Notes  Rivka Barbara, CMA - 04/15/2020 10:00 AM EST Formatting of this note might be different from the original. Review of Systems  Musculoskeletal: Positive for arthralgias.  All other systems reviewed and are negative.   Electronically signed by Rivka Barbara, [place] at 04/15/2020 10:16 AM EST    Reviewed  H+P. No changes noted.

## 2020-04-28 NOTE — Op Note (Signed)
04/28/2020  9:43 AM  PATIENT:  Richard Stark   MRN: [id number]  PRE-OPERATIVE DIAGNOSIS:  Primary localized osteoarthritis of leftknee   POST-OPERATIVE DIAGNOSIS:  Same   PROCEDURE:  Procedure(s): left TOTAL KNEE ARTHROPLASTY   SURGEON: Leitha Schuller, MD   ASSISTANTS: Cranston Neighbor PA-C   ANESTHESIA:   spinal   EBL:  100   BLOOD ADMINISTERED:none   DRAINS: incisional wound vac    LOCAL MEDICATIONS USED:  MARCAINE    and OTHER Morphine, Exparel   SPECIMEN:  No Specimen   DISPOSITION OF SPECIMEN:  N/A   COUNTS:  YES   TOURNIQUET:  32 at 300 mm Hg   IMPLANTS: Medacta  GMK sphere system with 5 femur, 4 tibia with short stem and 10 mm insert.  Size 3 patella, all components cemented.   DICTATION: Reubin Milan Dictation   patient was brought to the operating room and spinal anesthesia was obtained.  After prepping and draping the left leg in sterile fashion, and after patient identification and timeout procedures were completed, tourniquet was raised  and midline skin incision was made followed by medial parapatellar arthrotomy with severe erosion medial compartment osteoarthritis, severe patellofemoral arthritis and mild lateral compartment arthritis, partial synovectomy was also carried out.   The ACL and PCL and fat pad were excised along with anterior horns of the meniscus. The proximal tibia cutting guide from  the [facility] system was applied and the proximal tibia cut carried out.  The distal femoral cut was carried out in a similar fashion     The 5 femoral cutting guide applied with anterior posterior and chamfer cuts made.  The posterior horns of the menisci were removed at this point.   Injection of the above medication was carried out after the femoral and tibial cuts were carried out.  The 4 baseplate trial was placed pinned into position and proximal tibial preparation carried out with drilling hand reaming and the keel punch followed by placement of the 5 femur and sizing  the tibial insert size  10 millimeter gave the best fit with stability and full extension.  The distal femoral drill holes were made in the notch cut for the trochlear groove was then carried out with trials were then removed the patella was cut using the patellar cutting guide and it sized to a size 3after drill holes have been made  The knee was irrigated with pulsatile lavage and the bony surfaces dried the tibial component was cemented into place first.  Excess cement was removed and the polyethylene insert placed with a torque screw placed with a torque screwdriver tightened.  The distal femoral component was placed and the knee was held in extension as the patellar button was clamped into place.  After the cement was set, excess cement was removed and the knee was again irrigated thoroughly thoroughly irrigated.  The tourniquet was let down and hemostasis checked with electrocautery. The arthrotomy was repaired with a heavy Quill suture,  followed by 3-0 V lock subcuticular closure, skin staples followed by incisional wound VAC and Polar Care.Marland Kitchen   PLAN OF CARE: Admit to inpatient    PATIENT DISPOSITION:  PACU - hemodynamically stable.

## 2020-04-28 NOTE — Progress Notes (Signed)
PT Cancellation Note  Patient Details Name: Richard Stark MRN: [id number] DOB: 11/15/1950   Cancelled Treatment:    Reason Eval/Treat Not Completed: Other (comment). Pt reported 10/10 pain upon PT entrance, and declining PT currently. PT called RN to notify of pt pain levels, stated she would be in shortly to assess patient. Pt educated on PT return later in PM for re-attempt, and pt adamant that he will not be able to today despite encouragement, education and expectations of stay after elective TKA. Pt reported "we'll see", and this Thereasa Parkin said she would be back later.  Olga Coaster PT, DPT 1:28 PM,04/28/20

## 2020-04-28 NOTE — Evaluation (Signed)
Physical Therapy Evaluation Patient Details Name: Richard Stark MRN: [id number] DOB: 1950-08-23 Today's Date: 04/28/2020   History of Present Illness  Patient is a 70 yo male s/p L TKA, WBAT. PMH of current smoker, HTN, CVA with residual R arm weakness, GERD, DM.    Clinical Impression  Pt alert, agreeable to PT, reported 8/10 pain in L knee. Pt stated he lives with his niece who will be unable to assist at discharge, normally modI/I at baseline, denied recent falls.  The patient was able to perform supine exercises with tactile and verbal cues, light assist for LLE as needed due to pain. The patient was minA to supine to sit to EOB, good sitting balance noted. Sit <> Stand with RW and CGA, and able to take several steps to recliner in room. Up in chair with all needs in reach at end of session.  Overall the patient demonstrated deficits (see "PT Problem List") that impede the patient's functional abilities, safety, and mobility and would benefit from skilled PT intervention. Recommendation is SNF pending pt progress due to current level of assistance needed and lack of caregiver support at home.     Follow Up Recommendations SNF    Equipment Recommendations  Rolling walker with 5" wheels    Recommendations for Other Services       Precautions / Restrictions Precautions Precautions: Fall;Knee Restrictions Weight Bearing Restrictions: Yes LLE Weight Bearing: Weight bearing as tolerated      Mobility  Bed Mobility Overal bed mobility: Needs Assistance Bed Mobility: Supine to Sit     Supine to sit: Min assist;HOB elevated     General bed mobility comments: minA for LLE    Transfers Overall transfer level: Needs assistance Equipment used: Rolling walker (2 wheeled) Transfers: Sit to/from Stand Sit to Stand: Min guard         General transfer comment: cued for hand placement  Ambulation/Gait Ambulation/Gait assistance: Min guard Gait Distance (Feet): 3  Feet Assistive device: Rolling walker (2 wheeled)       General Gait Details: step to, up to chair only at pt request due to pain  Stairs            Wheelchair Mobility    Modified Rankin (Stroke Patients Only)       Balance Overall balance assessment: Needs assistance Sitting-balance support: Feet supported Sitting balance-Leahy Scale: Good       Standing balance-Leahy Scale: Fair                               Pertinent Vitals/Pain Pain Assessment: 0-10 Pain Score: 8  Pain Location: L knee Pain Descriptors / Indicators: Aching;Guarding;Grimacing Pain Intervention(s): Limited activity within patient's tolerance;Monitored during session;Premedicated before session;Repositioned;Ice applied    Home Living Family/patient expects to be discharged to:: Private residence Living Arrangements: Other relatives (niece) Available Help at Discharge: Family;Available PRN/intermittently;Other (Comment) (niece works) Type of Home: House Home Access: Stairs to enter Entrance Stairs-Rails: Left Entrance Stairs-Number of Steps: 2 Home Layout: One level;Able to live on main level with bedroom/bathroom Home Equipment: Gilmer Mor - single point      Prior Function Level of Independence: Independent               Hand Dominance   Dominant Hand: Right    Extremity/Trunk Assessment   Upper Extremity Assessment Upper Extremity Assessment: Overall WFL for tasks assessed    Lower Extremity Assessment Lower Extremity Assessment: RLE deficits/detail;LLE  deficits/detail RLE Deficits / Details: WFLs LLE Deficits / Details: s/p L TKA    Cervical / Trunk Assessment Cervical / Trunk Assessment: Normal  Communication   Communication: No difficulties  Cognition Arousal/Alertness: Awake/alert Behavior During Therapy: WFL for tasks assessed/performed Overall Cognitive Status: Within Functional Limits for tasks assessed                                         General Comments      Exercises Total Joint Exercises Ankle Circles/Pumps: AROM;Strengthening;Both;10 reps Quad Sets: AROM;Strengthening;Left;10 reps Hip ABduction/ADduction: AROM;Strengthening;Left;10 reps Goniometric ROM: -3 to 95 degrees   Assessment/Plan    PT Assessment Patient needs continued PT services  PT Problem List Decreased strength;Decreased mobility;Decreased range of motion;Decreased activity tolerance;Decreased balance;Pain;Decreased knowledge of use of DME;Decreased knowledge of precautions       PT Treatment Interventions DME instruction;Therapeutic exercise;Gait training;Balance training;Stair training;Neuromuscular re-education;Functional mobility training;Patient/family education;Therapeutic activities    PT Goals (Current goals can be found in the Care Plan section)  Acute Rehab PT Goals Patient Stated Goal: to go to rehab PT Goal Formulation: With patient Time For Goal Achievement: 05/12/20 Potential to Achieve Goals: Good    Frequency BID   Barriers to discharge        Co-evaluation               AM-PAC PT "6 Clicks" Mobility  Outcome Measure Help needed turning from your back to your side while in a flat bed without using bedrails?: A Little Help needed moving from lying on your back to sitting on the side of a flat bed without using bedrails?: A Little Help needed moving to and from a bed to a chair (including a wheelchair)?: A Little Help needed standing up from a chair using your arms (e.g., wheelchair or bedside chair)?: A Little Help needed to walk in hospital room?: A Little Help needed climbing 3-5 steps with a railing? : A Little 6 Click Score: 18    End of Session Equipment Utilized During Treatment: Gait belt Activity Tolerance: Patient tolerated treatment well Patient left: in chair;with chair alarm set;with call bell/phone within reach Nurse Communication: Mobility status PT Visit Diagnosis: Other abnormalities of  gait and mobility (R26.89);Muscle weakness (generalized) (M62.81);Difficulty in walking, not elsewhere classified (R26.2)    Time: 7902-4097 PT Time Calculation (min) (ACUTE ONLY): 34 min   Charges:   PT Evaluation $PT Eval Low Complexity: 1 Low PT Treatments $Therapeutic Exercise: 8-22 mins        Olga Coaster PT, DPT 4:39 PM,04/28/20

## 2020-04-29 LAB — GLUCOSE, CAPILLARY
Glucose-Capillary: 137 mg/dL — ABNORMAL HIGH (ref 70–99)
Glucose-Capillary: 125 mg/dL — ABNORMAL HIGH (ref 70–99)
Glucose-Capillary: 148 mg/dL — ABNORMAL HIGH (ref 70–99)
Glucose-Capillary: 97 mg/dL (ref 70–99)

## 2020-04-29 LAB — BASIC METABOLIC PANEL WITH GFR
Anion gap: 9 (ref 5–15)
BUN: 11 mg/dL (ref 8–23)
CO2: 25 mmol/L (ref 22–32)
Calcium: 8.7 mg/dL — ABNORMAL LOW (ref 8.9–10.3)
Chloride: 102 mmol/L (ref 98–111)
Creatinine, Ser: 1.09 mg/dL (ref 0.61–1.24)
GFR, Estimated: >60 mL/min
Glucose, Bld: 155 mg/dL — ABNORMAL HIGH (ref 70–99)
Potassium: 3.2 mmol/L — ABNORMAL LOW (ref 3.5–5.1)
Sodium: 136 mmol/L (ref 135–145)

## 2020-04-29 LAB — CBC
HCT: 31.8 % — ABNORMAL LOW (ref 39.0–52.0)
Hemoglobin: 11.2 g/dL — ABNORMAL LOW (ref 13.0–17.0)
MCH: 30.3 pg (ref 26.0–34.0)
MCHC: 35.2 g/dL (ref 30.0–36.0)
MCV: 85.9 fL (ref 80.0–100.0)
Platelets: 187 10*3/uL (ref 150–400)
RBC: 3.7 MIL/uL — ABNORMAL LOW (ref 4.22–5.81)
RDW: 13 % (ref 11.5–15.5)
WBC: 6.8 10*3/uL (ref 4.0–10.5)
nRBC: 0 % (ref 0.0–0.2)

## 2020-04-29 MED ORDER — POTASSIUM CHLORIDE 20 MEQ PO PACK
20.0000 meq | PACK | Freq: Three times a day (TID) | ORAL | Status: AC
  Administered 2020-04-29 (×3): 20 meq via ORAL
  Filled 2020-04-29 (×3): qty 1

## 2020-04-29 NOTE — Anesthesia Postprocedure Evaluation (Signed)
Anesthesia Post Note  Patient: Richard Stark  Procedure(s) Performed: TOTAL KNEE ARTHROPLASTY (Left Knee)  Patient location during evaluation: Nursing Unit Anesthesia Type: Spinal Level of consciousness: oriented and awake and alert Pain management: pain level controlled Vital Signs Assessment: post-procedure vital signs reviewed and stable Respiratory status: spontaneous breathing and respiratory function stable Cardiovascular status: blood pressure returned to baseline and stable Postop Assessment: no headache, no backache, no apparent nausea or vomiting and patient able to bend at knees Anesthetic complications: no   No complications documented.   Last Vitals:  Vitals:   04/29/20 0439 04/29/20 0729  BP: 138/81 (!) 145/77  Pulse: 80 82  Resp: 17 17  Temp: 36.9 C 37.5 C  SpO2: 98% 95%    Last Pain:  Vitals:   04/29/20 0729  TempSrc: Oral  PainSc:                  Jules Schick

## 2020-04-29 NOTE — Evaluation (Signed)
Occupational Therapy Evaluation Patient Details Name: Richard Stark MRN: [id number] DOB: Apr 20, 1950 Today's Date: 04/29/2020    History of Present Illness Patient is a 70 yo male s/p L TKA, WBAT. PMH of current smoker, HTN, CVA with residual R arm weakness, GERD, DM.   Clinical Impression   Pt seen for OT evaluation this date, POD#1 from above surgery. Pt was independent in all ADL prior to surgery, living with niece who does majority of cleaning and cooking. Pt reports he drives and his sister in law helps him manages his medication. Denies falls. Pt is eager to return to PLOF with less pain and improved safety and independence. Pt currently requires minimal assist for LB dressing and bathing while in seated position due to pain and limited AROM of L knee. Pt instructed in ADL transfers, RW mgt, compression stocking mgt, polar care mgt, AE/DME, self care skills, and home/routines modifications; handout provided to support recall and carryover. Pt verbalized understanding. Pt would benefit from skilled OT services including additional instruction in dressing techniques with or without assistive devices for dressing and bathing skills to support recall and carryover prior to discharge and ultimately to maximize safety, independence, and minimize falls risk and caregiver burden. Based on increased assist required for ADL/mobility and impairments in pain, balance, safety, and strength, recommend SNF at this time.     Follow Up Recommendations  SNF    Equipment Recommendations  3 in 1 bedside commode    Recommendations for Other Services       Precautions / Restrictions Precautions Precautions: Fall;Knee Precaution Booklet Issued: Yes (comment) Restrictions Weight Bearing Restrictions: Yes LLE Weight Bearing: Weight bearing as tolerated      Mobility Bed Mobility Overal bed mobility: Needs Assistance Bed Mobility: Supine to Sit     Supine to sit: Min assist;HOB elevated      General bed mobility comments: increased time/effort, use of bed rails    Transfers Overall transfer level: Needs assistance Equipment used: Rolling walker (2 wheeled) Transfers: Sit to/from Stand Sit to Stand: Min guard;Min assist         General transfer comment: cues for hands, feet, RW mgt, safety    Balance Overall balance assessment: Needs assistance Sitting-balance support: Feet supported Sitting balance-Leahy Scale: Good     Standing balance support: Bilateral upper extremity supported Standing balance-Leahy Scale: Fair                             ADL either performed or assessed with clinical judgement   ADL Overall ADL's : Needs assistance/impaired                                       General ADL Comments: Pt requires MIN A for LB ADL tasks, CGA-MIN A for ADL transfers, cues for RW mgt and safety, MAX A for compression stocking mgt and polar care mgt     Vision Baseline Vision/History: Wears glasses Wears Glasses: At all times Patient Visual Report: No change from baseline       Perception     Praxis      Pertinent Vitals/Pain Pain Assessment: 0-10 Pain Score: 8  Pain Location: L knee Pain Descriptors / Indicators: Aching;Guarding;Grimacing Pain Intervention(s): Limited activity within patient's tolerance;Monitored during session;Premedicated before session;Repositioned;Ice applied     Hand Dominance Right   Extremity/Trunk Assessment Upper Extremity Assessment  Upper Extremity Assessment: Overall WFL for tasks assessed   Lower Extremity Assessment Lower Extremity Assessment: LLE deficits/detail LLE Deficits / Details: s/p L TKA       Communication Communication Communication: No difficulties   Cognition Arousal/Alertness: Awake/alert Behavior During Therapy: WFL for tasks assessed/performed Overall Cognitive Status: No family/caregiver present to determine baseline cognitive functioning                                  General Comments: alert and oriented, occasional verbal cues for safety   General Comments       Exercises Other Exercises Other Exercises: Pt instructed in ADL transfers, RW mgt, compression stocking mgt, polar care mgt, AE/DME, self care skills, and home/routines modifications; handout provided to support recall and carryover   Shoulder Instructions      Home Living Family/patient expects to be discharged to:: Private residence Living Arrangements: Other relatives (niece) Available Help at Discharge: Family;Available PRN/intermittently;Other (Comment) (niece works) Type of Home: House Home Access: Stairs to enter Secretary/administrator of Steps: 2 Entrance Stairs-Rails: Left Home Layout: One level;Able to live on main level with bedroom/bathroom     Bathroom Shower/Tub: Chief Strategy Officer: Standard     Home Equipment: Cane - single point          Prior Functioning/Environment Level of Independence: Independent                 OT Problem List: Decreased strength;Pain;Decreased range of motion;Decreased activity tolerance;Decreased safety awareness;Impaired balance (sitting and/or standing);Decreased knowledge of use of DME or AE;Decreased knowledge of precautions      OT Treatment/Interventions: Self-care/ADL training;Therapeutic exercise;Therapeutic activities;DME and/or AE instruction;Patient/family education;Balance training    OT Goals(Current goals can be found in the care plan section) Acute Rehab OT Goals Patient Stated Goal: to go to rehab OT Goal Formulation: With patient Time For Goal Achievement: 05/13/20 Potential to Achieve Goals: Good ADL Goals Pt Will Perform Lower Body Dressing: with min guard assist;sit to/from stand Pt Will Transfer to Toilet: with supervision;ambulating (elevated commode, LRAD for amb) Additional ADL Goal #1: Pt will instruct family/caregiver in compression stocking mgt with modified  independence Additional ADL Goal #2: Pt will instruct family/caregiver in polar care mgt with modified independence  OT Frequency: Min 2X/week   Barriers to D/C:            Co-evaluation              AM-PAC OT "6 Clicks" Daily Activity     Outcome Measure Help from another person eating meals?: None Help from another person taking care of personal grooming?: None Help from another person toileting, which includes using toliet, bedpan, or urinal?: A Little Help from another person bathing (including washing, rinsing, drying)?: A Little Help from another person to put on and taking off regular upper body clothing?: None Help from another person to put on and taking off regular lower body clothing?: A Little 6 Click Score: 21   End of Session Equipment Utilized During Treatment: Gait belt;Rolling walker  Activity Tolerance: Patient tolerated treatment well Patient left: in chair;with call bell/phone within reach;with chair alarm set;with nursing/sitter in room;Other (comment) (polar care, drain in place)  OT Visit Diagnosis: Other abnormalities of gait and mobility (R26.89);Muscle weakness (generalized) (M62.81);Pain Pain - Right/Left: Left Pain - part of body: Knee  Time: 4496-7591 OT Time Calculation (min): 18 min Charges:  OT General Charges $OT Visit: 1 Visit OT Evaluation $OT Eval Moderate Complexity: 1 Mod OT Treatments $Self Care/Home Management : 8-22 mins  Wynona Canes, MPH, MS, OTR/L ascom [phone] 04/29/20, 10:41 AM

## 2020-04-29 NOTE — Progress Notes (Signed)
   Subjective: 1 Day Post-Op Procedure(s) (LRB): TOTAL KNEE ARTHROPLASTY (Left) Patient reports pain as 10 on 0-10 scale.   Patient is well, and has had no acute complaints or problems Denies any CP, SOB, ABD pain. We will continue therapy today.  Plan is to go Home after hospital stay.  Objective: Vital signs in last 24 hours: Temp:  [97.3 F (36.3 C)-99.5 F (37.5 C)] 99.5 F (37.5 C) (03/23 0729) Pulse Rate:  [58-82] 82 (03/23 0729) Resp:  [10-18] 17 (03/23 0729) BP: (95-168)/(70-81) 145/77 (03/23 0729) SpO2:  [93 %-100 %] 95 % (03/23 0729) Weight:  [92.2 kg] 92.2 kg (03/22 1100)  Intake/Output from previous day: 03/22 0701 - 03/23 0700 In: 1846.7 [P.O.:360; I.V.:1100; IV Piggyback:386.7] Out: 650 [Urine:500; Drains:100; Blood:50] Intake/Output this shift: No intake/output data recorded.  Recent Labs    04/29/20 0418  HGB 11.2*   Recent Labs    04/29/20 0418  WBC 6.8  RBC 3.70*  HCT 31.8*  PLT 187   Recent Labs    04/29/20 0418  NA 136  K 3.2*  CL 102  CO2 25  BUN 11  CREATININE 1.09  GLUCOSE 155*  CALCIUM 8.7*   No results for input(s): LABPT, INR in the last 72 hours.  EXAM General - Patient is Alert and Appropriate Extremity - Neurovascular intact Sensation intact distally Intact pulses distally Dorsiflexion/Plantar flexion intact No cellulitis present Compartment soft Dressing - dressing C/D/I and no drainage, prevena intact with out drainage Motor Function - intact, moving foot and toes well on exam.   Past Medical History:  Diagnosis Date  . Arthritis   . Diabetes mellitus without complication (HCC)   . GERD (gastroesophageal reflux disease)    OCC NO MEDS  . Hypertension   . Stroke (HCC) 2008   RIGHT ARM WEAKNESS    Assessment/Plan:   1 Day Post-Op Procedure(s) (LRB): TOTAL KNEE ARTHROPLASTY (Left) Active Problems:   S/P TKR (total knee replacement) using cement, left  Estimated body mass index is 32.81 kg/m as calculated  from the following:   Height as of this encounter: 5\' 6"  (1.676 m).   Weight as of this encounter: 92.2 kg. Advance diet Up with therapy  Hypokalemia - replete with oral K Recheck labs in the am VSS Pain severe. No oxycodone since 8:30 last night. Continue with better utilization of current pain regimen CM to assist with discharge to home with HHPT  DVT Prophylaxis - Aspirin, TED hose and SCDs, Plavix Weight-Bearing as tolerated to left leg   T. , PA-C [facility] 04/29/2020, 8:05 AM

## 2020-04-29 NOTE — Progress Notes (Signed)
Physical Therapy Treatment Patient Details Name: Richard Stark MRN: [id number] DOB: [birth year] Today's Date: 04/29/2020    History of Present Illness Patient is a 70 yo male s/p L TKA, WBAT. PMH of current smoker, HTN, CVA with residual R arm weakness, GERD, DM.    PT Comments    Pt alert, agreeable to therapy, up in chair at start of session. The patient needed physical assistance for LAQ and heel slides due to pain/LE weakness. Sit <> stand from recliner with RW and minA, cues needed for hand placement throughout to maximize safety. He was able to ambulate to the door and back with RW and CGA ~70ft, very slow, instructed in step to gait to maximize safety, pt needed step by step cueing to maintain technique. Returned to chair with all needs in reach, student RN in room at end of session. The patient would benefit from further skilled PT intervention to continue to progress towards goals. Recommendation remains appropriate.       Follow Up Recommendations  SNF     Equipment Recommendations  Rolling walker with 5" wheels    Recommendations for Other Services       Precautions / Restrictions Precautions Precautions: Fall;Knee Precaution Booklet Issued: Yes (comment) Restrictions Weight Bearing Restrictions: Yes LLE Weight Bearing: Weight bearing as tolerated    Mobility  Bed Mobility Overal bed mobility: Needs Assistance Bed Mobility: Supine to Sit     Supine to sit: Min assist;HOB elevated     General bed mobility comments: put up in chair at start/end of session    Transfers Overall transfer level: Needs assistance Equipment used: Rolling walker (2 wheeled) Transfers: Sit to/from Stand Sit to Stand: Min assist         General transfer comment: cues for hands, feet, RW mgt, safety  Ambulation/Gait Ambulation/Gait assistance: Min guard Gait Distance (Feet): 18 Feet Assistive device: Rolling walker (2 wheeled)       General Gait Details: pt cued for  step to pattern to maximize safety and pain control, step by step sequencing needed for technique   Stairs             Wheelchair Mobility    Modified Rankin (Stroke Patients Only)       Balance Overall balance assessment: Needs assistance Sitting-balance support: Feet supported Sitting balance-Leahy Scale: Good     Standing balance support: Bilateral upper extremity supported Standing balance-Leahy Scale: Poor Standing balance comment: pt reliant on UE support while statically standing                            Cognition Arousal/Alertness: Awake/alert Behavior During Therapy: WFL for tasks assessed/performed Overall Cognitive Status: No family/caregiver present to determine baseline cognitive functioning                                 General Comments: alert and oriented, occasional verbal cues for safety      Exercises Total Joint Exercises Long Arc Quad: AAROM;Strengthening;10 reps;Left Knee Flexion: AAROM;Strengthening;10 reps;Left Other Exercises Other Exercises: Pt instructed in ADL transfers, RW mgt, compression stocking mgt, polar care mgt, AE/DME, self care skills, and home/routines modifications; handout provided to support recall and carryover    General Comments        Pertinent Vitals/Pain Pain Assessment: 0-10 Pain Score: 8  Pain Location: L knee Pain Descriptors / Indicators: Aching;Guarding;Grimacing Pain Intervention(s): Limited activity  within patient's tolerance;Monitored during session;Premedicated before session;Repositioned;Ice applied    Home Living Family/patient expects to be discharged to:: Private residence Living Arrangements: Other relatives (niece) Available Help at Discharge: Family;Available PRN/intermittently;Other (Comment) (niece works) Type of Home: House Home Access: Stairs to enter Entrance Stairs-Rails: Left Home Layout: One level;Able to live on main level with bedroom/bathroom Home  Equipment: Gilmer Mor - single point      Prior Function Level of Independence: Independent          PT Goals (current goals can now be found in the care plan section) Acute Rehab PT Goals Patient Stated Goal: to go to rehab Progress towards PT goals: Progressing toward goals    Frequency    BID      PT Plan Current plan remains appropriate    Co-evaluation              AM-PAC PT "6 Clicks" Mobility   Outcome Measure  Help needed turning from your back to your side while in a flat bed without using bedrails?: A Little Help needed moving from lying on your back to sitting on the side of a flat bed without using bedrails?: A Little Help needed moving to and from a bed to a chair (including a wheelchair)?: A Little Help needed standing up from a chair using your arms (e.g., wheelchair or bedside chair)?: A Little Help needed to walk in hospital room?: A Little Help needed climbing 3-5 steps with a railing? : A Lot 6 Click Score: 17    End of Session Equipment Utilized During Treatment: Gait belt Activity Tolerance: No increased pain Patient left: in chair;with chair alarm set;with call bell/phone within reach Nurse Communication: Mobility status PT Visit Diagnosis: Other abnormalities of gait and mobility (R26.89);Muscle weakness (generalized) (M62.81);Difficulty in walking, not elsewhere classified (R26.2)     Time: 3662-9476 PT Time Calculation (min) (ACUTE ONLY): 19 min  Charges:  $Gait Training: 8-22 mins                     Olga Coaster PT, DPT 12:12 PM,04/29/20

## 2020-04-29 NOTE — Progress Notes (Signed)
Pt with some observed anxiety and SOB during a walk with the student nurse this AM around 11am.  He was walking into bathroom and began to breathe heavily and wheeze. Student nurse pulled CODE BLUE button and this nurse entered room to find pt sitting up on side of bed having a hard time catching his breath.  VS were obtained 02 was 94% on room air.  Once pt was able to calm down he was able to catch his breath under a minute.  At this point he was breathing without issue, he was laid back in bed by this nurse.  Dr. Rosita Kea was on the floor and entered the room to visit with pt, no new orders. Pt settled into bed with cool wash cloth on his head and requested a sip of water.    Pt was working PT later in the afternoon and pt had a similar episode around 3pm. It took pt approx the same amount of time to recover and sats never dropped below 90% on room air. Pt was attempting to stand at bedside with PT.  Therapy concluded for the day.  Pt has since been resting soundly in bed NAD noted.  Resp are even and unlabored.

## 2020-04-29 NOTE — Progress Notes (Addendum)
Physical Therapy Treatment Patient Details Name: Richard Stark MRN: [id number] DOB: October 27, 1950 Today's Date: 04/29/2020    History of Present Illness Patient is a 70 yo male s/p L TKA, WBAT. PMH of current smoker, HTN, CVA with residual R arm weakness, GERD, DM.    PT Comments    Patient alert, reported and episode earlier in the day of SOB, wheezing and had several RN's in room to assess him, situation confirmed by PT with RN prior to mobility. spO2 assessed several times, in supine and in sitting, spO2 and HR WFLs. Exercises performed with physical assist in supine, and pt sat EOB minA for LLE management. Education and cueing needed for sit <> stand with RW and minA to maximize pt safety and participation. He was able to take 1-2 steps to the L at EOB with CGA as well. Upon returning to sitting, pt began gasping, wheezing, and started to throw himself back on the back, maxA to return safely and RN notified immediately and in room to assist and assess pt. Pt spO2 and HR WFLs once in bed and pt calm and safe prior to PT exiting his room. The patient would benefit from further skilled PT intervention to continue to progress towards goals. Recommendation remains appropriate.      Follow Up Recommendations  SNF     Equipment Recommendations  Rolling walker with 5" wheels    Recommendations for Other Services       Precautions / Restrictions Precautions Precautions: Fall;Knee Precaution Booklet Issued: Yes (comment) Restrictions Weight Bearing Restrictions: Yes LLE Weight Bearing: Weight bearing as tolerated    Mobility  Bed Mobility Overal bed mobility: Needs Assistance Bed Mobility: Supine to Sit;Sit to Supine     Supine to sit: Min assist Sit to supine: Max assist   General bed mobility comments: minA for LLE assist to sit up, maxA to return to supine due to pt wheezing and what appeared to be an anxiety attack    Transfers Overall transfer level: Needs  assistance Equipment used: Rolling walker (2 wheeled) Transfers: Sit to/from Stand Sit to Stand: Min assist         General transfer comment: cues for hands, feet, RW mgt, safety  Ambulation/Gait Ambulation/Gait assistance: Min guard Gait Distance (Feet): 1 Feet Assistive device: Rolling walker (2 wheeled)       General Gait Details: Pt able to take 1-2 steps laterally towards HOB. Further ambulation deferred due to episode of pt wheezing and what appeared to be an anxiety attack, SOB. Staff notified immediately and in room to assess pt   Stairs             Wheelchair Mobility    Modified Rankin (Stroke Patients Only)       Balance Overall balance assessment: Needs assistance Sitting-balance support: Feet supported Sitting balance-Leahy Scale: Good     Standing balance support: Bilateral upper extremity supported Standing balance-Leahy Scale: Poor Standing balance comment: pt reliant on UE support while statically standing                            Cognition Arousal/Alertness: Awake/alert Behavior During Therapy: WFL for tasks assessed/performed;Anxious Overall Cognitive Status: No family/caregiver present to determine baseline cognitive functioning                                 General Comments: alert oriented, anxious  Exercises Total Joint Exercises Ankle Circles/Pumps: AROM;Strengthening;Both;10 reps Quad Sets: AROM;Strengthening;Left;10 reps;Right Short Arc Quad: Strengthening;Left;10 reps;AAROM Heel Slides: AAROM;Strengthening;Left;10 reps Hip ABduction/ADduction: AAROM;Strengthening;Left;10 reps Long Arc Quad: AAROM;Strengthening;10 reps;Left Knee Flexion: AAROM;Strengthening;10 reps;Left Goniometric ROM: -5 degrees, unable to obtain knee flexion ROM per goniometer, appeared to be at least 80degrees this PM Other Exercises Other Exercises: When pt was returning to sitting, pt experienced a wheezing episode, SOB,  and what appeared to be an anxiety attack. RN notified and in room immediately    General Comments        Pertinent Vitals/Pain Pain Assessment: Faces Pain Score: 8  Faces Pain Scale: Hurts little more Pain Location: L knee Pain Descriptors / Indicators: Aching;Guarding;Grimacing Pain Intervention(s): Limited activity within patient's tolerance;Monitored during session;Repositioned;Ice applied    Home Living                      Prior Function            PT Goals (current goals can now be found in the care plan section) Progress towards PT goals: Progressing toward goals    Frequency    BID      PT Plan Current plan remains appropriate    Co-evaluation              AM-PAC PT "6 Clicks" Mobility   Outcome Measure  Help needed turning from your back to your side while in a flat bed without using bedrails?: A Little Help needed moving from lying on your back to sitting on the side of a flat bed without using bedrails?: A Little Help needed moving to and from a bed to a chair (including a wheelchair)?: A Little Help needed standing up from a chair using your arms (e.g., wheelchair or bedside chair)?: A Little Help needed to walk in hospital room?: A Little Help needed climbing 3-5 steps with a railing? : A Lot 6 Click Score: 17    End of Session Equipment Utilized During Treatment: Gait belt Activity Tolerance: No increased pain Patient left: in chair;with chair alarm set;with call bell/phone within reach Nurse Communication: Mobility status PT Visit Diagnosis: Other abnormalities of gait and mobility (R26.89);Muscle weakness (generalized) (M62.81);Difficulty in walking, not elsewhere classified (R26.2)     Time: 3762-8315 PT Time Calculation (min) (ACUTE ONLY): 27 min  Charges:  $Gait Training: 8-22 mins $Therapeutic Exercise: 23-37 mins                     Olga Coaster PT, DPT 3:53 PM,04/29/20

## 2020-04-30 LAB — CBC
HCT: 30.6 % — ABNORMAL LOW (ref 39.0–52.0)
Hemoglobin: 10.8 g/dL — ABNORMAL LOW (ref 13.0–17.0)
MCH: 30.1 pg (ref 26.0–34.0)
MCHC: 35.3 g/dL (ref 30.0–36.0)
MCV: 85.2 fL (ref 80.0–100.0)
Platelets: 177 10*3/uL (ref 150–400)
RBC: 3.59 MIL/uL — ABNORMAL LOW (ref 4.22–5.81)
RDW: 13 % (ref 11.5–15.5)
WBC: 6 10*3/uL (ref 4.0–10.5)
nRBC: 0 % (ref 0.0–0.2)

## 2020-04-30 LAB — BASIC METABOLIC PANEL
Anion gap: 10 (ref 5–15)
BUN: 12 mg/dL (ref 8–23)
CO2: 26 mmol/L (ref 22–32)
Calcium: 8.9 mg/dL (ref 8.9–10.3)
Chloride: 98 mmol/L (ref 98–111)
Creatinine, Ser: 1.09 mg/dL (ref 0.61–1.24)
GFR, Estimated: >60 mL/min (ref >60)
Glucose, Bld: 117 mg/dL — ABNORMAL HIGH (ref 70–99)
Potassium: 3.1 mmol/L — ABNORMAL LOW (ref 3.5–5.1)
Sodium: 134 mmol/L — ABNORMAL LOW (ref 135–145)

## 2020-04-30 LAB — GLUCOSE, CAPILLARY
Glucose-Capillary: 146 mg/dL — ABNORMAL HIGH (ref 70–99)
Glucose-Capillary: 149 mg/dL — ABNORMAL HIGH (ref 70–99)
Glucose-Capillary: 159 mg/dL — ABNORMAL HIGH (ref 70–99)
Glucose-Capillary: 105 mg/dL — ABNORMAL HIGH (ref 70–99)

## 2020-04-30 MED ORDER — DOCUSATE SODIUM 100 MG PO CAPS: 100.0000 mg | ORAL_CAPSULE | Freq: Two times a day (BID) | ORAL | 0 refills | Status: AC

## 2020-04-30 MED ORDER — OXYCODONE HCL 5 MG PO TABS: 5.0000 mg | ORAL_TABLET | ORAL | 0 refills | Status: DC | PRN

## 2020-04-30 MED ORDER — ASPIRIN 81 MG PO CHEW: 81.0000 mg | CHEWABLE_TABLET | Freq: Two times a day (BID) | ORAL | 0 refills | Status: AC

## 2020-04-30 MED ORDER — POTASSIUM CHLORIDE 20 MEQ PO PACK
20.0000 meq | PACK | Freq: Four times a day (QID) | ORAL | Status: AC
  Administered 2020-04-30 – 2020-05-01 (×4): 20 meq via ORAL
  Filled 2020-04-30 (×4): qty 1

## 2020-04-30 MED ORDER — METHOCARBAMOL 500 MG PO TABS: 500.0000 mg | ORAL_TABLET | Freq: Four times a day (QID) | ORAL | 0 refills | Status: AC | PRN

## 2020-04-30 NOTE — Progress Notes (Addendum)
   Subjective: 2 Days Post-Op Procedure(s) (LRB): TOTAL KNEE ARTHROPLASTY (Left) Patient reports pain as moderate.   Patient is well, and has had no acute complaints or problems Denies any CP, SOB, ABD pain. We will continue therapy today.  Plan is to go Skilled nursing facility after hospital stay.  Objective: Vital signs in last 24 hours: Temp:  [98.8 F (37.1 C)-99.8 F (37.7 C)] 99 F (37.2 C) (03/24 0423) Pulse Rate:  [75-85] 85 (03/24 0423) Resp:  [17-19] 18 (03/24 0423) BP: (124-161)/(71-92) 124/71 (03/24 0423) SpO2:  [95 %-98 %] 97 % (03/24 0423)  Intake/Output from previous day: 03/23 0701 - 03/24 0700 In: 0  Out: 1000 [Urine:900; Drains:100] Intake/Output this shift: No intake/output data recorded.  Recent Labs    04/29/20 0418 04/30/20 0539  HGB 11.2* 10.8*   Recent Labs    04/29/20 0418 04/30/20 0539  WBC 6.8 6.0  RBC 3.70* 3.59*  HCT 31.8* 30.6*  PLT 187 177   Recent Labs    04/29/20 0418 04/30/20 0539  NA 136 134*  K 3.2* 3.1*  CL 102 98  CO2 25 26  BUN 11 12  CREATININE 1.09 1.09  GLUCOSE 155* 117*  CALCIUM 8.7* 8.9   No results for input(s): LABPT, INR in the last 72 hours.  EXAM General - Patient is Alert and Appropriate Extremity - Neurovascular intact Sensation intact distally Intact pulses distally Dorsiflexion/Plantar flexion intact No cellulitis present Compartment soft Dressing - dressing C/D/I and no drainage, prevena intact with 100 cc drainage Motor Function - intact, moving foot and toes well on exam.   Past Medical History:  Diagnosis Date  . Arthritis   . Diabetes mellitus without complication (HCC)   . GERD (gastroesophageal reflux disease)    OCC NO MEDS  . Hypertension   . Stroke (HCC) 2008   RIGHT ARM WEAKNESS    Assessment/Plan:   2 Days Post-Op Procedure(s) (LRB): TOTAL KNEE ARTHROPLASTY (Left) Active Problems:   S/P TKR (total knee replacement) using cement, left  Estimated body mass index is  32.81 kg/m as calculated from the following:   Height as of this encounter: 5\' 6"  (1.676 m).   Weight as of this encounter: 92.2 kg. Advance diet Up with therapy  Hypokalemia - replete with oral K Recheck labs in the am VSS Pain better controlled CM to assist with discharge to SNF  DVT Prophylaxis - Aspirin, TED hose and SCDs, Plavix Weight-Bearing as tolerated to left leg   T. , PA-C [facility] 04/30/2020, 7:16 AM

## 2020-04-30 NOTE — Progress Notes (Signed)
   04/30/20 1035  Clinical Encounter Type  Visited With Patient  Visit Type Initial  Referral From Nurse  Consult/Referral To Chaplain  Spiritual Encounters  Spiritual Needs Emotional  Chaplain Ormond visited room !A-137A, Pt Richard Stark. Pt was sitting up in the chair next to his bed and he appeared to be in good spirits. Pt was alert and talkative. He stated, he had an episode on yesterday and doctors came in running from everywhere, but he is ok today.  I provided reflective listening, a ministry of presence and emotional support.

## 2020-04-30 NOTE — Progress Notes (Signed)
Physical Therapy Treatment Patient Details Name: Richard Stark MRN: [id number] DOB: [birth year] Today's Date: 04/30/2020    History of Present Illness Patient is a 70 yo male s/p L TKA, WBAT. PMH of current smoker, HTN, CVA with residual R arm weakness, GERD, DM.    PT Comments    Patient alert, in bed, agreeable to PT. Pt and PT spoke about previous experience with mobility and pt anxiety and fear of SOB. Pt encouraged and educated on current status and benefit of continued therapy and mobility attempts and pt verbalized understanding though still nervous. Orthostatic vitals assessed as well, pt without complaints of light headedness or dizziness, may have experience mild orthostatic drop with coming up to sitting that improved immediately and was not present with changing positions to standing. Pt required light minA for bed mobility for LLE, as well as minA with RW and step by step sequencing to sit <> stand. He was able to take several steps towards recliner in room, further mobility deferred due to pt pain and fatigue. RN in room for medication at end of session. The patient would benefit from further skilled PT intervention to continue to progress towards goals. Recommendation remains appropriate.      Follow Up Recommendations  SNF     Equipment Recommendations  Rolling walker with 5" wheels    Recommendations for Other Services       Precautions / Restrictions Precautions Precautions: Fall;Knee Restrictions Weight Bearing Restrictions: Yes LLE Weight Bearing: Weight bearing as tolerated    Mobility  Bed Mobility Overal bed mobility: Needs Assistance Bed Mobility: Supine to Sit     Supine to sit: Min assist     General bed mobility comments: very light minA to shift weight to sit EOB, pt able to perform with less assist than previous session    Transfers Overall transfer level: Needs assistance Equipment used: Rolling walker (2 wheeled) Transfers: Sit to/from  Stand Sit to Stand: Min assist         General transfer comment: cues for hands, feet, RW mgt, safety  Ambulation/Gait             General Gait Details: true ambulation deferred due to pt anxiety and pain.   Stairs             Wheelchair Mobility    Modified Rankin (Stroke Patients Only)       Balance Overall balance assessment: Needs assistance Sitting-balance support: Feet supported Sitting balance-Leahy Scale: Good     Standing balance support: Bilateral upper extremity supported Standing balance-Leahy Scale: Poor Standing balance comment: pt reliant on UE support while statically standing                            Cognition Arousal/Alertness: Awake/alert Behavior During Therapy: WFL for tasks assessed/performed;Anxious Overall Cognitive Status: No family/caregiver present to determine baseline cognitive functioning                                 General Comments: alert oriented, anxious      Exercises Total Joint Exercises Ankle Circles/Pumps: AROM;Strengthening;Both;10 reps Heel Slides: Strengthening;Left;10 reps;AROM Hip ABduction/ADduction: AAROM;Strengthening;Left;10 reps    General Comments        Pertinent Vitals/Pain Pain Assessment: 0-10 Pain Score: 5  Pain Location: L knee Pain Descriptors / Indicators: Aching;Guarding;Grimacing Pain Intervention(s): Limited activity within patient's tolerance;Monitored during session;Repositioned;Ice applied  Home Living                      Prior Function            PT Goals (current goals can now be found in the care plan section) Progress towards PT goals: Progressing toward goals    Frequency    BID      PT Plan Current plan remains appropriate    Co-evaluation              AM-PAC PT "6 Clicks" Mobility   Outcome Measure  Help needed turning from your back to your side while in a flat bed without using bedrails?: A Little Help  needed moving from lying on your back to sitting on the side of a flat bed without using bedrails?: A Little Help needed moving to and from a bed to a chair (including a wheelchair)?: A Little Help needed standing up from a chair using your arms (e.g., wheelchair or bedside chair)?: A Little Help needed to walk in hospital room?: A Lot Help needed climbing 3-5 steps with a railing? : A Lot 6 Click Score: 16    End of Session Equipment Utilized During Treatment: Gait belt Activity Tolerance: Patient tolerated treatment well Patient left: in chair;with chair alarm set;with call bell/phone within reach Nurse Communication: Mobility status PT Visit Diagnosis: Other abnormalities of gait and mobility (R26.89);Muscle weakness (generalized) (M62.81);Difficulty in walking, not elsewhere classified (R26.2)     Time: 8527-7824 PT Time Calculation (min) (ACUTE ONLY): 30 min  Charges:  $Therapeutic Exercise: 23-37 mins                     Olga Coaster PT, DPT 11:12 AM,04/30/20

## 2020-04-30 NOTE — Discharge Summary (Signed)
Physician Discharge Summary  Patient ID: Richard Stark MRN: [id number] DOB/AGE: [birth year] 70 y.o.  Admit date: 04/28/2020 Discharge date: 05/01/2020 Admission Diagnoses:  S/P TKR (total knee replacement) using cement, left [Z96.652]   Discharge Diagnoses: Patient Active Problem List   Diagnosis Date Noted  . S/P TKR (total knee replacement) using cement, left 04/28/2020    Past Medical History:  Diagnosis Date  . Arthritis   . Diabetes mellitus without complication (HCC)   . GERD (gastroesophageal reflux disease)    OCC NO MEDS  . Hypertension   . Stroke [facility]) 2008   RIGHT ARM WEAKNESS     Transfusion: none   Consultants (if any):   Discharged Condition: Improved  Hospital Course: Richard Stark is an 70 y.o. male who was admitted 04/28/2020 with a diagnosis of left knee osteoathritis and went to the operating room on 04/28/2020 and underwent the above named procedures.    Surgeries: Procedure(s): TOTAL KNEE ARTHROPLASTY on 04/28/2020 Patient tolerated the surgery well. Taken to PACU where she was stabilized and then transferred to the orthopedic floor.  Started on aspirin and plavix. Foot pumps applied bilaterally at 80 mm. Heels elevated on bed with rolled towels. No evidence of DVT. Negative Homan. Physical therapy started on day #1 for gait training and transfer. OT started day #1 for ADL and assisted devices.  Patient's foley was d/c on day #1. Patient's IV  was d/c on day #2. Hypokalemia treated with Oral K.  On post op day #3 patient was stable and ready for discharge to SNF.  Implants: Medacta GMK sphere system with26femur, 4tibia with short stem and37mm insert. Size3patella, all components cemented.               TOTAL KNEE REPLACEMENT POSTOPERATIVE DIRECTIONS  Knee Rehabilitation, Guidelines Following Surgery  Results after knee surgery are often greatly improved when you follow the exercise, range of motion and muscle  strengthening exercises prescribed by your doctor. Safety measures are also important to protect the knee from further injury. Any time any of these exercises cause you to have increased pain or swelling in your knee joint, decrease the amount until you are comfortable again and slowly increase them. If you have problems or questions, call your caregiver or physical therapist for advice.   HOME CARE INSTRUCTIONS  Remove items at home which could result in a fall. This includes throw rugs or furniture in walking pathways.   Continue to use polar care unit on the knee for pain and swelling from surgery. You may notice swelling that will progress down to the foot and ankle.  This is normal after surgery.  Elevate the leg when you are not up walking on it.    Continue to use the breathing machine which will help keep your temperature down.  It is common for your temperature to cycle up and down following surgery, especially at night when you are not up moving around and exerting yourself.  The breathing machine keeps your lungs expanded and your temperature down.  Do not place pillow under knee, focus on keeping the knee STRAIGHT while resting  DIET You may resume your previous home diet once your are discharged from the hospital.  DRESSING / WOUND CARE / SHOWERING Please remove provena negative pressure dressing on 05/08/2020 and apply honey comb dressing. Keep dressing clean and dry at all times.  ACTIVITY Walk with your walker as instructed. Use walker as long as suggested by your caregivers. Avoid periods of inactivity  such as sitting longer than an hour when not asleep. This helps prevent blood clots.  You may resume a sexual relationship in one month or when given the OK by your doctor.  You may return to work once you are cleared by your doctor.  Do not drive a car for 6 weeks or until released by you surgeon.  Do not drive while taking narcotics.  WEIGHT BEARING Weight bearing as  tolerated with assist device (walker, cane, etc) as directed, use it as long as suggested by your surgeon or therapist, typically at least 4-6 weeks.  POSTOPERATIVE CONSTIPATION PROTOCOL Constipation - defined medically as fewer than three stools per week and severe constipation as less than one stool per week.  One of the most common issues patients have following surgery is constipation.  Even if you have a regular bowel pattern at home, your normal regimen is likely to be disrupted due to multiple reasons following surgery.  Combination of anesthesia, postoperative narcotics, change in appetite and fluid intake all can affect your bowels.  In order to avoid complications following surgery, here are some recommendations in order to help you during your recovery period.  Colace (docusate) - Pick up an over-the-counter form of Colace or another stool softener and take twice a day as long as you are requiring postoperative pain medications.  Take with a full glass of water daily.  If you experience loose stools or diarrhea, hold the colace until you stool forms back up.  If your symptoms do not get better within 1 week or if they get worse, check with your doctor.  Dulcolax (bisacodyl) - Pick up over-the-counter and take as directed by the product packaging as needed to assist with the movement of your bowels.  Take with a full glass of water.  Use this product as needed if not relieved by Colace only.   MiraLax (polyethylene glycol) - Pick up over-the-counter to have on hand.  MiraLax is a solution that will increase the amount of water in your bowels to assist with bowel movements.  Take as directed and can mix with a glass of water, juice, soda, coffee, or tea.  Take if you go more than two days without a movement. Do not use MiraLax more than once per day. Call your doctor if you are still constipated or irregular after using this medication for 7 days in a row.  If you continue to have problems  with postoperative constipation, please contact the office for further assistance and recommendations.  If you experience "the worst abdominal pain ever" or develop nausea or vomiting, please contact the office immediatly for further recommendations for treatment.  ITCHING  If you experience itching with your medications, try taking only a single pain pill, or even half a pain pill at a time.  You can also use Benadryl over the counter for itching or also to help with sleep.   TED HOSE STOCKINGS Wear the elastic stockings on both legs for six weeks following surgery during the day but you may remove then at night for sleeping.  MEDICATIONS See your medication summary on the "After Visit Summary" that the nursing staff will review with you prior to discharge.  You may have some home medications which will be placed on hold until you complete the course of blood thinner medication.  It is important for you to complete the blood thinner medication as prescribed by your surgeon.  Continue your approved medications as instructed at time of discharge.  PRECAUTIONS If you experience chest pain or shortness of breath - call 911 immediately for transfer to the hospital emergency department.  If you develop a fever greater that 101 F, purulent drainage from wound, increased redness or drainage from wound, foul odor from the wound/dressing, or calf pain - CONTACT YOUR SURGEON.                                                   FOLLOW-UP APPOINTMENTS Make sure you keep all of your appointments after your operation with your surgeon and caregivers. You should call the office at the above phone number and make an appointment for approximately two weeks after the date of your surgery or on the date instructed by your surgeon outlined in the "After Visit Summary".   RANGE OF MOTION AND STRENGTHENING EXERCISES  Rehabilitation of the knee is important following a knee injury or an operation. After just a few days  of immobilization, the muscles of the thigh which control the knee become weakened and shrink (atrophy). Knee exercises are designed to build up the tone and strength of the thigh muscles and to improve knee motion. Often times heat used for twenty to thirty minutes before working out will loosen up your tissues and help with improving the range of motion but do not use heat for the first two weeks following surgery. These exercises can be done on a training (exercise) mat, on the floor, on a table or on a bed. Use what ever works the best and is most comfortable for you Knee exercises include:  Leg Lifts - While your knee is still immobilized in a splint or cast, you can do straight leg raises. Lift the leg to 60 degrees, hold for 3 sec, and slowly lower the leg. Repeat 10-20 times 2-3 times daily. Perform this exercise against resistance later as your knee gets better.  Quad and Hamstring Sets - Tighten up the muscle on the front of the thigh (Quad) and hold for 5-10 sec. Repeat this 10-20 times hourly. Hamstring sets are done by pushing the foot backward against an object and holding for 5-10 sec. Repeat as with quad sets.   Leg Slides: Lying on your back, slowly slide your foot toward your buttocks, bending your knee up off the floor (only go as far as is comfortable). Then slowly slide your foot back down until your leg is flat on the floor again.  Angel Wings: Lying on your back spread your legs to the side as far apart as you can without causing discomfort.  A rehabilitation program following serious knee injuries can speed recovery and prevent re-injury in the future due to weakened muscles. Contact your doctor or a physical therapist for more information on knee rehabilitation.   IF YOU ARE TRANSFERRED TO A SKILLED REHAB FACILITY If the patient is transferred to a skilled rehab facility following release from the hospital, a list of the current medications will be sent to the facility for the  patient to continue.  When discharged from the skilled rehab facility, please have the facility set up the patient's Home Health Physical Therapy prior to being released. Also, the skilled facility will be responsible for providing the patient with their medications at time of release from the facility to include their pain medication, the muscle relaxants, and their blood thinner medication.  If the patient is still at the rehab facility at time of the two week follow up appointment, the skilled rehab facility will also need to assist the patient in arranging follow up appointment in our office and any transportation needs.  MAKE SURE YOU:  Understand these instructions.  Get help right away if you are not doing well or get worse.                       He was given perioperative antibiotics:  Anti-infectives (From admission, onward)   Start     Dose/Rate Route Frequency Ordered Stop   04/28/20 1400  ceFAZolin (ANCEF) IVPB 2g/100 mL premix        2 g 200 mL/hr over 30 Minutes Intravenous Every 6 hours 04/28/20 1027 04/29/20 0745   04/28/20 0615  ceFAZolin (ANCEF) IVPB 2g/100 mL premix        2 g 200 mL/hr over 30 Minutes Intravenous On call to O.R. 04/28/20 0606 04/28/20 0745   04/28/20 0612  ceFAZolin (ANCEF) 2-4 GM/100ML-% IVPB       Note to Pharmacy: Desma Paganini   : cabinet override      04/28/20 0612 04/28/20 1349    .  He was given sequential compression devices, early ambulation, and Aspirin Plavix TEDs for DVT prophylaxis.  He benefited maximally from the hospital stay and there were no complications.    Recent vital signs:  Vitals:   04/30/20 2035 05/01/20 0502  BP: 138/67 (!) 169/65  Pulse: 77 79  Resp: 18 18  Temp: 98.2 F (36.8 C) 98.4 F (36.9 C)  SpO2: 99% 97%    Recent laboratory studies:  Lab Results  Component Value Date   HGB 9.9 (L) 05/01/2020   HGB 10.8 (L) 04/30/2020   HGB 11.2 (L) 04/29/2020   Lab Results  Component Value Date   WBC  5.7 05/01/2020   PLT 160 05/01/2020   Lab Results  Component Value Date   INR 1.0 06/08/2012   Lab Results  Component Value Date   NA 133 (L) 05/01/2020   K 3.3 (L) 05/01/2020   CL 97 (L) 05/01/2020   CO2 28 05/01/2020   BUN 16 05/01/2020   CREATININE 1.11 05/01/2020   GLUCOSE 94 05/01/2020    Discharge Medications:   Allergies as of 05/01/2020      Reactions   Lisinopril Anaphylaxis      Medication List    STOP taking these medications   traMADol 50 MG tablet Commonly known as: ULTRAM     TAKE these medications   acetaminophen 500 MG tablet Commonly known as: TYLENOL Take 500-1,000 mg by mouth every 8 (eight) hours as needed for moderate pain.   amLODipine 10 MG tablet Commonly known as: NORVASC Take 10 mg by mouth every morning.   aspirin 81 MG chewable tablet Chew 1 tablet (81 mg total) by mouth 2 (two) times daily for 14 days.   atorvastatin 40 MG tablet Commonly known as: LIPITOR Take 40 mg by mouth daily.   carvedilol 25 MG tablet Commonly known as: COREG Take 25 mg by mouth 2 (two) times daily with a meal.   chlorthalidone 25 MG tablet Commonly known as: HYGROTON Take 25 mg by mouth daily.   clopidogrel 75 MG tablet Commonly known as: PLAVIX Take 75 mg by mouth daily.   docusate sodium 100 MG capsule Commonly known as: COLACE Take 1 capsule (100 mg total) by mouth 2 (two) times daily.  gabapentin 300 MG capsule Commonly known as: NEURONTIN Take 300 mg by mouth 2 (two) times daily.   hydrALAZINE 25 MG tablet Commonly known as: APRESOLINE Take 25 mg by mouth 3 (three) times daily.   HYDROcodone-acetaminophen 5-325 MG tablet Commonly known as: NORCO/VICODIN Take 1-2 tablets by mouth every 4 (four) hours as needed for moderate pain.   Lantus SoloStar 100 UNIT/ML Solostar Pen Generic drug: insulin glargine Inject 25 Units into the skin at bedtime.   metFORMIN 1000 MG tablet Commonly known as: GLUCOPHAGE Take 1,000 mg by mouth in the  morning and at bedtime.   methocarbamol 500 MG tablet Commonly known as: ROBAXIN Take 1 tablet (500 mg total) by mouth every 6 (six) hours as needed for muscle spasms.   Victoza 18 MG/3ML Sopn Generic drug: liraglutide Inject 1.8 mg into the skin every morning.            Durable Medical Equipment  (From admission, onward)         Start     Ordered   04/28/20 1028  DME Walker rolling  Once       Question Answer Comment  Walker: With 5 Inch Wheels   Patient needs a walker to treat with the following condition S/P TKR (total knee replacement) using cement, left      04/28/20 1027   04/28/20 1028  DME 3 n 1  Once        04/28/20 1027   04/28/20 1028  DME Bedside commode  Once       Question:  Patient needs a bedside commode to treat with the following condition  Answer:  S/P TKR (total knee replacement) using cement, left   04/28/20 1027          Diagnostic Studies: DG Knee 1-2 Views Left  Result Date: 04/28/2020 CLINICAL DATA:  Status post left knee replacement today. EXAM: LEFT KNEE - 1-2 VIEW COMPARISON:  CT left knee 02/10/2020. FINDINGS: New total knee arthroplasty is in place. Surgical drain and staples are noted. No acute finding. IMPRESSION: Status post left knee replacement.  No acute abnormality. Electronically Signed   By: Drusilla Kanner M.D.   On: 04/28/2020 10:32    Disposition: Discharge disposition: 03-Skilled Nursing Facility          Contact information for follow-up providers    Evon Slack, PA-C Follow up in 2 week(s).   Specialties: Orthopedic Surgery, Emergency Medicine Contact information: [address] [place] [address] [phone]            Contact information for after-discharge care    Destination    [facility] Preferred SNF .   Service: Skilled Nursing Contact information: [address] [place] [address] [phone]                   Signed: Patience Musca 05/01/2020, 7:18 AM

## 2020-04-30 NOTE — Discharge Instructions (Signed)

## 2020-04-30 NOTE — TOC Initial Note (Addendum)
Transition of Care [facility]) - Initial/Assessment Note    Patient Details  Name: Richard Stark MRN: [id number] Date of Birth: 05-14-1950  Transition of Care [facility]) CM/SW Contact:    Candie Chroman, LCSW Phone Number: 04/30/2020, 12:46 PM  Clinical Narrative:  CSW met with patient. No supports at bedside. CSW introduced role and explained that PT recommendations would be discussed. Patient is agreeable to SNF placement and says that [facility] is his first preference because his niece works there. Admissions coordinator will review. Per chart review, patient is fully COVID vaccinated with booster. No further concerns. CSW encouraged patient to contact CSW as needed. CSW will continue to follow patient for support and facilitate discharge to SNF once medically stable.     1:14 pm: [facility] is able to offer patient a bed. Sent secure chat to orthopedic PA to see if we have an anticipated discharge date.  4:15 pm: Starting insurance authorization in case patient will be ready for discharge tomorrow. Sent secure chat to MD and PA requesting COVID test if patient will be ready tomorrow.      Expected Discharge Plan: Skilled Nursing Facility Barriers to Discharge: Continued Medical Work up,Insurance Authorization,SNF Pending bed offer   Patient Goals and CMS Choice        Expected Discharge Plan and Services Expected Discharge Plan: [place] Acute Care Choice: [place] arrangements for the past 2 months: Single Family Home                                      Prior Living Arrangements/Services Living arrangements for the past 2 months: Single Family Home Lives with:: Relatives (Niece) Patient language and need for interpreter reviewed:: Yes Do you feel safe going back to the place where you live?: Yes      Need for Family Participation in Patient Care: Yes (Comment) Care giver support system in place?: Yes  (comment)   Criminal Activity/Legal Involvement Pertinent to Current Situation/Hospitalization: No - Comment as needed  Activities of Daily Living Home Assistive Devices/Equipment: Cane (specify quad or straight),Eyeglasses ADL Screening (condition at time of admission) Patient's cognitive ability adequate to safely complete daily activities?: Yes Is the patient deaf or have difficulty hearing?: No Does the patient have difficulty seeing, even when wearing glasses/contacts?: No Does the patient have difficulty concentrating, remembering, or making decisions?: No Patient able to express need for assistance with ADLs?: Yes Does the patient have difficulty dressing or bathing?: No Independently performs ADLs?: Yes (appropriate for developmental age) Does the patient have difficulty walking or climbing stairs?: Yes Weakness of Legs: None Weakness of Arms/Hands: None  Permission Sought/Granted Permission sought to share information with : Facility Art therapist granted to share information with : Yes, Verbal Permission Granted     Permission granted to share info w AGENCY: SNF's        Emotional Assessment Appearance:: Appears stated age Attitude/Demeanor/Rapport: Engaged,Gracious Affect (typically observed): Accepting,Appropriate,Calm,Pleasant Orientation: : Oriented to Self,Oriented to Place,Oriented to  Time,Oriented to Situation Alcohol / Substance Use: Not Applicable Psych Involvement: No (comment)  Admission diagnosis:  S/P TKR (total knee replacement) using cement, left [Z96.652] Patient Active Problem List   Diagnosis Date Noted  . S/P TKR (total knee replacement) using cement, left 04/28/2020   PCP:  Patient, No Pcp Per Pharmacy:   [id number] Phillip Heal,  [place] [address] AT [facility] OF [address] [place] [address] Phone: [phone] Fax: [phone]     Social Determinants of Health (SDOH)  Interventions    Readmission Risk Interventions No flowsheet data found.

## 2020-04-30 NOTE — Progress Notes (Signed)
Physical Therapy Treatment Patient Details Name: Richard Stark MRN: [id number] DOB: 1950-10-30 Today's Date: 04/30/2020    History of Present Illness Patient is a 70 yo male s/p L TKA, WBAT. PMH of current smoker, HTN, CVA with residual R arm weakness, GERD, DM.    PT Comments    Patient alert, up on [facility], eager to transfer from [facility] to bed. TotalA for pericare from CNA, minA with RW to take several steps to return to EOB. Pt exhibited some anxiety, and beginning to wheeze, but able to re-direct pt and return to supine with minA. Several exercises performed in supine with assist as needed. The patient would benefit from further skilled PT intervention to continue to progress towards goals. Recommendation remains appropriate.     Follow Up Recommendations  SNF     Equipment Recommendations  Rolling walker with 5" wheels    Recommendations for Other Services       Precautions / Restrictions Precautions Precautions: Fall;Knee Restrictions Weight Bearing Restrictions: Yes LLE Weight Bearing: Weight bearing as tolerated    Mobility  Bed Mobility Overal bed mobility: Needs Assistance Bed Mobility: Sit to Supine       Sit to supine: Min assist;HOB elevated   General bed mobility comments: minA for LE assist. Pt exhibited some wheezing, anxiety related behavior right before laying down, able to re-direct pt    Transfers Overall transfer level: Needs assistance Equipment used: Rolling walker (2 wheeled) Transfers: Sit to/from Stand Sit to Stand: Min assist         General transfer comment: cues for hands, feet, RW mgt, safety  Ambulation/Gait Ambulation/Gait assistance: Editor, commissioning (Feet): 2 Feet Assistive device: Rolling walker (2 wheeled)       General Gait Details: true ambulation deferred due to pt anxiety   Stairs             Wheelchair Mobility    Modified Rankin (Stroke Patients Only)       Balance Overall balance  assessment: Needs assistance Sitting-balance support: Feet supported Sitting balance-Leahy Scale: Good     Standing balance support: Bilateral upper extremity supported Standing balance-Leahy Scale: Poor Standing balance comment: pt reliant on UE support while statically standing                            Cognition Arousal/Alertness: Awake/alert Behavior During Therapy: WFL for tasks assessed/performed;Anxious Overall Cognitive Status: No family/caregiver present to determine baseline cognitive functioning                                 General Comments: alert oriented, anxious      Exercises Total Joint Exercises Ankle Circles/Pumps: AROM;Strengthening;Both;10 reps Quad Sets: AROM;Strengthening;Left;10 reps;Right Short Arc Quad: Strengthening;Left;10 reps;AAROM Heel Slides: Strengthening;Left;10 reps;AROM Hip ABduction/ADduction: AROM;Strengthening;Left;10 reps Goniometric ROM: -5 to 90 degrees Other Exercises Other Exercises: assisted from bSC to return to bed, further mobility deferred due to pt anxiety and pain    General Comments        Pertinent Vitals/Pain Pain Assessment: Faces Faces Pain Scale: Hurts a little bit Pain Location: L knee Pain Descriptors / Indicators: Aching;Guarding;Grimacing Pain Intervention(s): Limited activity within patient's tolerance;Monitored during session;Premedicated before session;Ice applied    Home Living                      Prior Function  PT Goals (current goals can now be found in the care plan section) Progress towards PT goals: Progressing toward goals    Frequency    BID      PT Plan Current plan remains appropriate    Co-evaluation              AM-PAC PT "6 Clicks" Mobility   Outcome Measure  Help needed turning from your back to your side while in a flat bed without using bedrails?: A Little Help needed moving from lying on your back to sitting on the  side of a flat bed without using bedrails?: A Little Help needed moving to and from a bed to a chair (including a wheelchair)?: A Little Help needed standing up from a chair using your arms (e.g., wheelchair or bedside chair)?: A Little Help needed to walk in hospital room?: A Lot Help needed climbing 3-5 steps with a railing? : A Lot 6 Click Score: 16    End of Session Equipment Utilized During Treatment: Gait belt Activity Tolerance: Patient tolerated treatment well Patient left: with call bell/phone within reach;in bed;with bed alarm set;with SCD's reapplied Nurse Communication: Mobility status PT Visit Diagnosis: Other abnormalities of gait and mobility (R26.89);Muscle weakness (generalized) (M62.81);Difficulty in walking, not elsewhere classified (R26.2)     Time: 1937-9024 PT Time Calculation (min) (ACUTE ONLY): 16 min  Charges:  $Therapeutic Exercise: 8-22 mins                     Olga Coaster PT, DPT 4:20 PM,04/30/20

## 2020-04-30 NOTE — NC FL2 (Signed)
[place] MEDICAID FL2 LEVEL OF CARE SCREENING TOOL     IDENTIFICATION  Patient Name: Richard Stark Birthdate: 23-Apr-1950 Sex: male Admission Date (Current Location): 04/28/2020  [place] and [place] Number:  Chiropodist and Address:  [facility], [address], [place], [place] [address]      Provider Number: [id number]  Attending Physician Name and Address:  Kennedy Bucker, MD  Relative Name and Phone Number:       Current Level of Care: Hospital Recommended Level of Care: Skilled Nursing Facility Prior Approval Number:    Date Approved/Denied:   PASRR Number: [id number]  Discharge Plan: SNF    Current Diagnoses: Patient Active Problem List   Diagnosis Date Noted  . S/P TKR (total knee replacement) using cement, left 04/28/2020    Orientation RESPIRATION BLADDER Height & Weight     Self,Time,Situation,Place  Normal Continent Weight: 203 lb 4.2 oz (92.2 kg) Height:  5\' 6"  (167.6 cm)  BEHAVIORAL SYMPTOMS/MOOD NEUROLOGICAL BOWEL NUTRITION STATUS   (None)  (None) Continent Diet (Carb modified)  AMBULATORY STATUS COMMUNICATION OF NEEDS Skin   Limited Assist Verbally Surgical wounds,Wound Vac (Prevena wound vac on left knee)                       Personal Care Assistance Level of Assistance  Bathing,Feeding,Dressing Bathing Assistance: Maximum assistance Feeding assistance: Limited assistance Dressing Assistance: Maximum assistance     Functional Limitations Info  Sight,Hearing,Speech Sight Info: Adequate Hearing Info: Adequate Speech Info: Adequate    SPECIAL CARE FACTORS FREQUENCY  PT (By licensed PT),OT (By licensed OT)     PT Frequency: 5 x week OT Frequency: 5 x week            Contractures Contractures Info: Not present    Additional Factors Info  Code Status,Allergies Code Status Info: Full code Allergies Info: Lisinopril           Current Medications (04/30/2020):  This is the current  hospital active medication list Current Facility-Administered Medications  Medication Dose Route Frequency Provider Last Rate Last Admin  . 0.9 %  sodium chloride infusion   Intravenous Continuous 05/02/2020, MD      . acetaminophen (TYLENOL) tablet 325-650 mg  325-650 mg Oral Q6H PRN Kennedy Bucker, MD      . alum & mag hydroxide-simeth (MAALOX/MYLANTA) 200-200-20 MG/5ML suspension 30 mL  30 mL Oral Q4H PRN 08-16-2000, MD      . amLODipine (NORVASC) tablet 10 mg  10 mg Oral Kennedy Bucker, Henrene Hawking, MD   10 mg at 04/30/20 [phone]  . aspirin chewable tablet 81 mg  81 mg Oral BID 9528, MD   81 mg at 04/30/20 0915  . atorvastatin (LIPITOR) tablet 40 mg  40 mg Oral Daily 05/02/20, MD   40 mg at 04/29/20 1700  . bisacodyl (DULCOLAX) suppository 10 mg  10 mg Rectal Daily PRN 05/01/20, MD      . carvedilol (COREG) tablet 25 mg  25 mg Oral BID WC Kennedy Bucker, MD   25 mg at 04/30/20 0919  . chlorthalidone (HYGROTON) tablet 25 mg  25 mg Oral Daily 05/02/20, MD   25 mg at 04/30/20 0926  . clopidogrel (PLAVIX) tablet 75 mg  75 mg Oral Daily 05/02/20, MD   75 mg at 04/30/20 0917  . diphenhydrAMINE (BENADRYL) 12.5 MG/5ML elixir 12.5-25 mg  12.5-25 mg Oral Q4H PRN 03-09-1984, MD      .  docusate sodium (COLACE) capsule 100 mg  100 mg Oral BID Kennedy Bucker, MD   100 mg at 04/30/20 8416  . gabapentin (NEURONTIN) capsule 300 mg  300 mg Oral BID Kennedy Bucker, MD   300 mg at 04/30/20 6063  . hydrALAZINE (APRESOLINE) tablet 25 mg  25 mg Oral TID Kennedy Bucker, MD   25 mg at 04/30/20 0916  . HYDROmorphone (DILAUDID) injection 0.5-1 mg  0.5-1 mg Intravenous Q4H PRN Kennedy Bucker, MD   1 mg at 04/29/20 0818  . insulin aspart (novoLOG) injection 0-15 Units  0-15 Units Subcutaneous TID WC Kennedy Bucker, MD   2 Units at 04/30/20 1211  . insulin glargine (LANTUS) injection 25 Units  25 Units Subcutaneous QHS Kennedy Bucker, MD   25 Units at 04/28/20 2147  . magnesium citrate solution 1  Bottle  1 Bottle Oral Once PRN Kennedy Bucker, MD      . magnesium hydroxide (MILK OF MAGNESIA) suspension 30 mL  30 mL Oral Daily PRN Kennedy Bucker, MD   30 mL at 04/30/20 0921  . menthol-cetylpyridinium (CEPACOL) lozenge 3 mg  1 lozenge Oral PRN Kennedy Bucker, MD       Or  . phenol (CHLORASEPTIC) mouth spray 1 spray  1 spray Mouth/Throat PRN Kennedy Bucker, MD      . metFORMIN (GLUCOPHAGE) tablet 1,000 mg  1,000 mg Oral BID WC Kennedy Bucker, MD   1,000 mg at 04/30/20 0916  . methocarbamol (ROBAXIN) tablet 500 mg  500 mg Oral Q6H PRN Kennedy Bucker, MD   500 mg at 04/29/20 0160   Or  . methocarbamol (ROBAXIN) 500 mg in dextrose 5 % 50 mL IVPB  500 mg Intravenous Q6H PRN Kennedy Bucker, MD      . metoCLOPramide (REGLAN) tablet 5-10 mg  5-10 mg Oral Q8H PRN Kennedy Bucker, MD       Or  . metoCLOPramide (REGLAN) injection 5-10 mg  5-10 mg Intravenous Q8H PRN Kennedy Bucker, MD      . ondansetron [facility]) tablet 4 mg  4 mg Oral Q6H PRN Kennedy Bucker, MD       Or  . ondansetron [facility]) injection 4 mg  4 mg Intravenous Q6H PRN Kennedy Bucker, MD      . oxyCODONE (Oxy IR/ROXICODONE) immediate release tablet 10-15 mg  10-15 mg Oral Q4H PRN Kennedy Bucker, MD   10 mg at 04/29/20 1656  . oxyCODONE (Oxy IR/ROXICODONE) immediate release tablet 5-10 mg  5-10 mg Oral Q4H PRN Kennedy Bucker, MD   5 mg at 04/30/20 0926  . pantoprazole (PROTONIX) EC tablet 40 mg  40 mg Oral Daily Kennedy Bucker, MD   40 mg at 04/29/20 1648  . potassium chloride (KLOR-CON) packet 20 mEq  20 mEq Oral Q6H Amador Cunas C, PA-C   20 mEq at 04/30/20 1211  . traMADol (ULTRAM) tablet 50 mg  50 mg Oral Q6H Kennedy Bucker, MD   50 mg at 04/30/20 1211  . zolpidem (AMBIEN) tablet 5 mg  5 mg Oral QHS PRN Kennedy Bucker, MD         Discharge Medications: Please see discharge summary for a list of discharge medications.  Relevant Imaging Results:  Relevant Lab Results:   Additional Information SS#: [id number]. Moderna vaccines: 04/02/19,  04/30/19, 02/14/20.  Margarito Liner, LCSW

## 2020-05-01 LAB — BASIC METABOLIC PANEL
Anion gap: 8 (ref 5–15)
BUN: 16 mg/dL (ref 8–23)
CO2: 28 mmol/L (ref 22–32)
Calcium: 8.6 mg/dL — ABNORMAL LOW (ref 8.9–10.3)
Chloride: 97 mmol/L — ABNORMAL LOW (ref 98–111)
Creatinine, Ser: 1.11 mg/dL (ref 0.61–1.24)
GFR, Estimated: >60 mL/min (ref >60)
Glucose, Bld: 94 mg/dL (ref 70–99)
Potassium: 3.3 mmol/L — ABNORMAL LOW (ref 3.5–5.1)
Sodium: 133 mmol/L — ABNORMAL LOW (ref 135–145)

## 2020-05-01 LAB — CBC
HCT: 28.1 % — ABNORMAL LOW (ref 39.0–52.0)
Hemoglobin: 9.9 g/dL — ABNORMAL LOW (ref 13.0–17.0)
MCH: 30.2 pg (ref 26.0–34.0)
MCHC: 35.2 g/dL (ref 30.0–36.0)
MCV: 85.7 fL (ref 80.0–100.0)
Platelets: 160 10*3/uL (ref 150–400)
RBC: 3.28 MIL/uL — ABNORMAL LOW (ref 4.22–5.81)
RDW: 12.9 % (ref 11.5–15.5)
WBC: 5.7 10*3/uL (ref 4.0–10.5)
nRBC: 0 % (ref 0.0–0.2)

## 2020-05-01 LAB — SARS CORONAVIRUS 2 (TAT 6-24 HRS): SARS Coronavirus 2: NEGATIVE

## 2020-05-01 LAB — GLUCOSE, CAPILLARY: Glucose-Capillary: 93 mg/dL (ref 70–99)

## 2020-05-01 MED ORDER — POTASSIUM CHLORIDE 20 MEQ PO PACK
40.0000 meq | PACK | Freq: Three times a day (TID) | ORAL | Status: DC
  Administered 2020-05-01: 40 meq via ORAL
  Filled 2020-05-01: qty 2

## 2020-05-01 MED ORDER — HYDROCODONE-ACETAMINOPHEN 5-325 MG PO TABS: 1.0000 | ORAL_TABLET | ORAL | 0 refills | Status: DC | PRN

## 2020-05-01 MED ORDER — HYDROCODONE-ACETAMINOPHEN 5-325 MG PO TABS: 1.0000 | ORAL_TABLET | ORAL | Status: DC | PRN

## 2020-05-01 NOTE — Progress Notes (Signed)
   Subjective: 3 Days Post-Op Procedure(s) (LRB): TOTAL KNEE ARTHROPLASTY (Left) Patient reports pain as mild.   Patient is well, and has had no acute complaints or problems Denies any CP, SOB, ABD pain. We will continue therapy today.  Plan is to go Skilled nursing facility after hospital stay.  Objective: Vital signs in last 24 hours: Temp:  [98.2 F (36.8 C)-98.9 F (37.2 C)] 98.4 F (36.9 C) (03/25 0502) Pulse Rate:  [77-82] 79 (03/25 0502) Resp:  [17-19] 18 (03/25 0502) BP: (123-169)/(61-71) 169/65 (03/25 0502) SpO2:  [97 %-99 %] 97 % (03/25 0502)  Intake/Output from previous day: 03/24 0701 - 03/25 0700 In: 240 [P.O.:240] Out: 710 [Urine:700; Drains:10] Intake/Output this shift: No intake/output data recorded.  Recent Labs    04/29/20 0418 04/30/20 0539 05/01/20 0456  HGB 11.2* 10.8* 9.9*   Recent Labs    04/30/20 0539 05/01/20 0456  WBC 6.0 5.7  RBC 3.59* 3.28*  HCT 30.6* 28.1*  PLT 177 160   Recent Labs    04/30/20 0539 05/01/20 0456  NA 134* 133*  K 3.1* 3.3*  CL 98 97*  CO2 26 28  BUN 12 16  CREATININE 1.09 1.11  GLUCOSE 117* 94  CALCIUM 8.9 8.6*   No results for input(s): LABPT, INR in the last 72 hours.  EXAM General - Patient is Alert and Appropriate Extremity - Neurovascular intact Sensation intact distally Intact pulses distally Dorsiflexion/Plantar flexion intact No cellulitis present Compartment soft Dressing - dressing C/D/I and no drainage, prevena intact with 100 cc drainage Motor Function - intact, moving foot and toes well on exam.   Past Medical History:  Diagnosis Date  . Arthritis   . Diabetes mellitus without complication (HCC)   . GERD (gastroesophageal reflux disease)    OCC NO MEDS  . Hypertension   . Stroke (HCC) 2008   RIGHT ARM WEAKNESS    Assessment/Plan:   3 Days Post-Op Procedure(s) (LRB): TOTAL KNEE ARTHROPLASTY (Left) Active Problems:   S/P TKR (total knee replacement) using cement,  left  Estimated body mass index is 32.81 kg/m as calculated from the following:   Height as of this encounter: 5\' 6"  (1.676 m).   Weight as of this encounter: 92.2 kg. Advance diet Up with therapy  Hypokalemia - , stable, replete with oral K VSS Pain well controlled CM to assist with discharge to SNF today  DVT Prophylaxis - Aspirin, TED hose and SCDs, Plavix Weight-Bearing as tolerated to left leg   T. , PA-C [facility] 05/01/2020, 7:16 AM

## 2020-05-01 NOTE — Care Management Important Message (Signed)
Important Message  Patient Details  Name: Richard Stark MRN: [id number] Date of Birth: 1950-10-02   Medicare Important Message Given:  Yes     [name] A [name] 05/01/2020, 9:01 AM

## 2020-05-01 NOTE — Progress Notes (Signed)
Patient adequate for discharge to [facility] VSS. Report called to [facility], nurse. Patient agreeable to discharge and picked up by nonemergent transport. No concerns at this time.

## 2020-05-01 NOTE — TOC Transition Note (Signed)
Transition of Care [facility]) - CM/SW Discharge Note   Patient Details  Name: KAIMEN PEINE MRN: [id number] Date of Birth: December 23, 1950  [facility] of Care [facility] CM/SW Contact:  Margarito Liner, LCSW Phone Number: 05/01/2020, 10:32 AM   Clinical Narrative: Berkley Harvey approved: [id number]. Valid 3/25-3/29. Patient has orders to discharge to [facility] today. RN will call report to [phone] (Room 602B). EMS transport has been arranged and he is next on the list. No further concerns. CSW signing off.   Final next level of care: Skilled Nursing Facility Barriers to Discharge: Barriers Resolved   Patient Goals and CMS Choice     Choice offered to / list presented to : Patient  Discharge Placement PASRR number recieved: 04/30/20            Patient chooses bed at: [facility]  Patient to be transferred to facility by: EMS Name of family member notified: Patient declined Patient and family notified of of transfer: 05/01/20  Discharge Plan and Services     Post Acute Care Choice: Skilled Nursing Facility                               Social Determinants of Health (SDOH) Interventions     Readmission Risk Interventions No flowsheet data found.

## 2021-07-26 ENCOUNTER — Other Ambulatory Visit: Payer: Self-pay | Admitting: Orthopedic Surgery

## 2021-07-26 DIAGNOSIS — M1711 Unilateral primary osteoarthritis, right knee: Secondary | ICD-10-CM

## 2021-08-13 ENCOUNTER — Ambulatory Visit
Admission: RE | Admit: 2021-08-13 | Discharge: 2021-08-13 | Disposition: A | Discharge status: Home / Self Care | Payer: Medicare Other | Source: Ambulatory Visit | Attending: Orthopedic Surgery | Admitting: Orthopedic Surgery

## 2021-08-13 DIAGNOSIS — M1711 Unilateral primary osteoarthritis, right knee: Secondary | ICD-10-CM | POA: Insufficient documentation

## 2021-08-18 ENCOUNTER — Other Ambulatory Visit: Payer: Self-pay | Admitting: Orthopedic Surgery

## 2021-09-06 ENCOUNTER — Other Ambulatory Visit: Payer: Self-pay

## 2021-09-06 ENCOUNTER — Encounter
Admission: RE | Admit: 2021-09-06 | Discharge: 2021-09-06 | Disposition: A | Discharge status: Home / Self Care | Payer: Medicare Other | Source: Ambulatory Visit | Attending: Orthopedic Surgery | Admitting: Orthopedic Surgery

## 2021-09-06 VITALS — BP 128/79 | HR 69 | Resp 16 | Ht 66.5 in | Wt 201.5 lb

## 2021-09-06 DIAGNOSIS — Z01818 Encounter for other preprocedural examination: Secondary | ICD-10-CM | POA: Diagnosis present

## 2021-09-06 DIAGNOSIS — Z01812 Encounter for preprocedural laboratory examination: Secondary | ICD-10-CM

## 2021-09-06 LAB — COMPREHENSIVE METABOLIC PANEL
ALT: 23 U/L (ref 0–44)
AST: 23 U/L (ref 15–41)
Albumin: 4.3 g/dL (ref 3.5–5.0)
Alkaline Phosphatase: 46 U/L (ref 38–126)
Anion gap: 11 (ref 5–15)
BUN: 9 mg/dL (ref 8–23)
CO2: 25 mmol/L (ref 22–32)
Calcium: 10.1 mg/dL (ref 8.9–10.3)
Chloride: 104 mmol/L (ref 98–111)
Creatinine, Ser: 1.23 mg/dL (ref 0.61–1.24)
GFR, Estimated: >60 mL/min (ref >60)
Glucose, Bld: 148 mg/dL — ABNORMAL HIGH (ref 70–99)
Potassium: 3.4 mmol/L — ABNORMAL LOW (ref 3.5–5.1)
Sodium: 140 mmol/L (ref 135–145)
Total Bilirubin: 1.1 mg/dL (ref 0.3–1.2)
Total Protein: 7.4 g/dL (ref 6.5–8.1)

## 2021-09-06 LAB — CBC WITH DIFFERENTIAL/PLATELET
Abs Immature Granulocytes: 0.02 10*3/uL (ref 0.00–0.07)
Basophils Absolute: 0.1 10*3/uL (ref 0.0–0.1)
Basophils Relative: 1 %
Eosinophils Absolute: 0.2 10*3/uL (ref 0.0–0.5)
Eosinophils Relative: 3 %
HCT: 45.4 % (ref 39.0–52.0)
Hemoglobin: 15.5 g/dL (ref 13.0–17.0)
Immature Granulocytes: 0 %
Lymphocytes Relative: 54 %
Lymphs Abs: 2.7 10*3/uL (ref 0.7–4.0)
MCH: 29.5 pg (ref 26.0–34.0)
MCHC: 34.1 g/dL (ref 30.0–36.0)
MCV: 86.3 fL (ref 80.0–100.0)
Monocytes Absolute: 0.5 10*3/uL (ref 0.1–1.0)
Monocytes Relative: 11 %
Neutro Abs: 1.6 10*3/uL — ABNORMAL LOW (ref 1.7–7.7)
Neutrophils Relative %: 31 %
Platelets: 210 10*3/uL (ref 150–400)
RBC: 5.26 MIL/uL (ref 4.22–5.81)
RDW: 13.8 % (ref 11.5–15.5)
WBC: 5 10*3/uL (ref 4.0–10.5)
nRBC: 0 % (ref 0.0–0.2)

## 2021-09-06 LAB — URINALYSIS, ROUTINE W REFLEX MICROSCOPIC
Bacteria, UA: NONE SEEN
Bilirubin Urine: NEGATIVE
Glucose, UA: >=500 mg/dL — AB
Hgb urine dipstick: NEGATIVE
Ketones, ur: NEGATIVE mg/dL
Leukocytes,Ua: NEGATIVE
Nitrite: NEGATIVE
Protein, ur: NEGATIVE mg/dL
Specific Gravity, Urine: 1.026 (ref 1.005–1.030)
pH: 6 (ref 5.0–8.0)

## 2021-09-06 LAB — SURGICAL PCR SCREEN
MRSA, PCR: NEGATIVE
Staphylococcus aureus: NEGATIVE

## 2021-09-06 LAB — TYPE AND SCREEN
ABO/RH(D): O NEG
Antibody Screen: NEGATIVE

## 2021-09-06 NOTE — Patient Instructions (Addendum)
Your procedure is scheduled on: 09/16/21 - Thursday Report to the Registration Desk on the 1st floor of the Medical Mall. To find out your arrival time, please call [phone] between 1PM - 3PM on: 09/15/21 - Wednesday If your arrival time is 6:00 am, do not arrive prior to that time as the [facility] entrance doors do not open until 6:00 am.  REMEMBER: Instructions that are not followed completely may result in serious medical risk, up to and including death; or upon the discretion of your surgeon and anesthesiologist your surgery may need to be rescheduled.  Do not eat food after midnight the night before surgery.  No gum chewing, lozengers or hard candies.  You may however, drink CLEAR liquids up to 2 hours before you are scheduled to arrive for your surgery. Do not drink anything within 2 hours of your scheduled arrival time.  Clear liquids include: - water  Type 1 and Type 2 diabetics should only drink water.  In addition, your doctor has ordered for you to drink the provided  Gatorade G2 Drinking this carbohydrate drink up to two hours before surgery helps to reduce insulin resistance and improve patient outcomes. Please complete drinking 2 hours prior to scheduled arrival time.  TAKE THESE MEDICATIONS THE MORNING OF SURGERY WITH A SIP OF WATER:  - amLODipine (NORVASC)   - carvedilol (COREG) - gabapentin (NEURONTIN) - hydrALAZINE (APRESOLINE)    - HOLD metFORMIN (GLUCOPHAGE) 1000 MG tablet beginning 09/14/21, may resume the day after your procedure.  - HOLD VICTOZA 18 MG/3ML SOPN 7 days before your procedure, may resume after your procedure is complete.  - INJECT only 1/2 of your prescribe dose of LANTUS SOLOSTAR on the night before your procedure.  Follow recommendations from Cardiologist, Pulmonologist or PCP regarding stopping Aspirin, Coumadin, Plavix, Eliquis, Pradaxa, or Pletal. Hold Plavix 3 days prior to your procedure , beginning 09/13/21, may resume with  doctor order.  One week prior to surgery: Stop Anti-inflammatories (NSAIDS) such as Advil, Aleve, Ibuprofen, Motrin, Naproxen, Naprosyn and Aspirin based products such as Excedrin, Goodys Powder, BC Powder.   Stop ANY OVER THE COUNTER supplements until after surgery.  You may however, continue to take Tylenol if needed for pain up until the day of surgery.  No Alcohol for 24 hours before or after surgery.  No Smoking including e-cigarettes for 24 hours prior to surgery.  No chewable tobacco products for at least 6 hours prior to surgery.  No nicotine patches on the day of surgery.  Do not use any "recreational" drugs for at least a week prior to your surgery.  Please be advised that the combination of cocaine and anesthesia may have negative outcomes, up to and including death. If you test positive for cocaine, your surgery will be cancelled.  On the morning of surgery brush your teeth with toothpaste and water, you may rinse your mouth with mouthwash if you wish. Do not swallow any toothpaste or mouthwash.  Use CHG Soap or wipes as directed on instruction sheet.  Do not wear jewelry, make-up, hairpins, clips or nail polish.  Do not wear lotions, powders, or perfumes.   Do not shave body from the neck down 48 hours prior to surgery just in case you cut yourself which could leave a site for infection.  Also, freshly shaved skin may become irritated if using the CHG soap.  Contact lenses, hearing aids and dentures may not be worn into surgery.  Do not bring valuables to the hospital.  [facility] is not responsible for any missing/lost belongings or valuables.   Notify your doctor if there is any change in your medical condition (cold, fever, infection).  Wear comfortable clothing (specific to your surgery type) to the hospital.  After surgery, you can help prevent lung complications by doing breathing exercises.  Take deep breaths and cough every 1-2 hours. Your doctor may order  a device called an Incentive Spirometer to help you take deep breaths. When coughing or sneezing, hold a pillow firmly against your incision with both hands. This is called "splinting." Doing this helps protect your incision. It also decreases belly discomfort.  If you are being admitted to the hospital overnight, leave your suitcase in the car. After surgery it may be brought to your room.  If you are being discharged the day of surgery, you will not be allowed to drive home. You will need a responsible adult (18 years or older) to drive you home and stay with you that night.   If you are taking public transportation, you will need to have a responsible adult (18 years or older) with you. Please confirm with your physician that it is acceptable to use public transportation.   Please call the Pre-admissions Testing Dept. at [phone] if you have any questions about these instructions.  Surgery Visitation Policy:  Patients undergoing a surgery or procedure may have two family members or support persons with them as long as the person is not COVID-19 positive or experiencing its symptoms.   Inpatient Visitation:    Visiting hours are 7 a.m. to 8 p.m. Up to four visitors are allowed at one time in a patient room, including children. The visitors may rotate out with other people during the day. One designated support person (adult) may remain overnight.

## 2021-09-13 NOTE — Progress Notes (Signed)
  Perioperative Services Pre-Admission/Anesthesia Testing    Date: 09/13/21  Name: Richard Stark MRN:   [id number]  Re: GLP-1 clearance and provider recommendations   Planned Surgical Procedure(s):    Case: [id number] Date/Time: 09/16/21 3532   Procedure: TOTAL KNEE ARTHROPLASTY (Right: Knee)   Anesthesia type: Choice   Pre-op diagnosis: Primary osteoarthritis of right knee  M17.11   Location: [facility] OR ROOM 01 / [facility]   Surgeons: Kennedy Bucker, MD   Clinical Notes:  Patient is scheduled for the above procedure on 09/16/2021 with Dr. Kennedy Bucker, MD. In review of his medication reconciliation it was noted that patient is on a prescribed GLP-1 medication (liraglutide). Per guidelines issued by the American Society of Anesthesiologists (ASA), it is recommended that these medications be held for 7 days prior to the patient undergoing any type of elective surgical procedure.   Reached out to prescribing provider Alla German, MD) to make them aware of the guidelines from anesthesia. Given that this patient takes the prescribe GLP-1 medication for his  diabetes diagnosis rather than for weight loss, recommendations from the prescribing provider were solicited. Prescribing provider made aware of the following so that informed decision/POC can be developed for this patient that may be taking medications belonging to these drug classes:  Oral GLP-1 medications will be held 1 day prior to surgery.  Injectable GLP-1 medications will be held 7 days prior to surgery.  Metformin is routinely held 48 hours prior to surgery due to renal concerns, potential need for contrasted imaging perioperatively, and the potential for tissue hypoxia leading to drug induced lactic acidosis.  All SGLT2i medications are held 72 hours prior to surgery as they can be associated with the increased potential for developing euglycemic diabetic ketoacidosis (EDKA).   Impression and Plan:  Richard Stark is on a prescribed GLP-1 medication, which induces the known side effect of decreased gastric emptying. Efforts are bring made to mitigate the risk of perioperative hyperglycemic events, as elevated blood glucose levels have been found to contribute to intra/postoperative complications. Additionally, hyperglycemic extremes can potentially necessitate the postponing of a patient's elective case in order to better optimize perioperative glycemic control, again with the aforementioned guidelines in place. With this in mind, recommendations have been sought from the prescribing provider, who has cleared patient to proceed with holding the prescribed GLP-1 as per the guidelines from the ASA.   Provider recommending: no further recommendations received from the prescribing provider.  Copy of signed clearance and recommendations placed on patient's chart for inclusion in their medical record and for review by the surgical/anesthetic team on the day of his procedure.   Quentin Mulling, MSN, APRN, FNP-C, CEN [facility]  Peri-operative Services Nurse Practitioner Phone: [phone] 09/13/21 8:46 AM  NOTE: This note has been prepared using Dragon dictation software. Despite my best ability to proofread, there is always the potential that unintentional transcriptional errors may still occur from this process.

## 2021-09-16 ENCOUNTER — Encounter
Admission: RE | Disposition: A | Discharge status: Home Health | Payer: Self-pay | Source: Home / Self Care | Attending: Orthopedic Surgery

## 2021-09-16 ENCOUNTER — Other Ambulatory Visit: Payer: Self-pay

## 2021-09-16 ENCOUNTER — Observation Stay
Admission: RE | Admit: 2021-09-16 | Discharge: 2021-09-21 | Disposition: A | Discharge status: Home Health | Payer: Medicare Other | Attending: Orthopedic Surgery | Admitting: Orthopedic Surgery

## 2021-09-16 ENCOUNTER — Observation Stay: Payer: Medicare Other

## 2021-09-16 ENCOUNTER — Ambulatory Visit: Payer: Medicare Other | Admitting: Urgent Care

## 2021-09-16 ENCOUNTER — Encounter: Payer: Self-pay | Admitting: Orthopedic Surgery

## 2021-09-16 DIAGNOSIS — Z794 Long term (current) use of insulin: Secondary | ICD-10-CM | POA: Diagnosis not present

## 2021-09-16 DIAGNOSIS — I129 Hypertensive chronic kidney disease with stage 1 through stage 4 chronic kidney disease, or unspecified chronic kidney disease: Secondary | ICD-10-CM | POA: Insufficient documentation

## 2021-09-16 DIAGNOSIS — Z79899 Other long term (current) drug therapy: Secondary | ICD-10-CM | POA: Diagnosis not present

## 2021-09-16 DIAGNOSIS — M1711 Unilateral primary osteoarthritis, right knee: Principal | ICD-10-CM | POA: Insufficient documentation

## 2021-09-16 DIAGNOSIS — N189 Chronic kidney disease, unspecified: Secondary | ICD-10-CM | POA: Diagnosis not present

## 2021-09-16 DIAGNOSIS — Z8673 Personal history of transient ischemic attack (TIA), and cerebral infarction without residual deficits: Secondary | ICD-10-CM | POA: Insufficient documentation

## 2021-09-16 DIAGNOSIS — Z7984 Long term (current) use of oral hypoglycemic drugs: Secondary | ICD-10-CM | POA: Diagnosis not present

## 2021-09-16 DIAGNOSIS — Z7902 Long term (current) use of antithrombotics/antiplatelets: Secondary | ICD-10-CM | POA: Diagnosis not present

## 2021-09-16 DIAGNOSIS — Z01812 Encounter for preprocedural laboratory examination: Secondary | ICD-10-CM

## 2021-09-16 DIAGNOSIS — Z96652 Presence of left artificial knee joint: Secondary | ICD-10-CM | POA: Insufficient documentation

## 2021-09-16 DIAGNOSIS — Z96651 Presence of right artificial knee joint: Secondary | ICD-10-CM

## 2021-09-16 DIAGNOSIS — E1122 Type 2 diabetes mellitus with diabetic chronic kidney disease: Secondary | ICD-10-CM | POA: Diagnosis not present

## 2021-09-16 HISTORY — PX: TOTAL KNEE ARTHROPLASTY: SHX125

## 2021-09-16 LAB — GLUCOSE, CAPILLARY
Glucose-Capillary: 138 mg/dL — ABNORMAL HIGH (ref 70–99)
Glucose-Capillary: 171 mg/dL — ABNORMAL HIGH (ref 70–99)
Glucose-Capillary: 185 mg/dL — ABNORMAL HIGH (ref 70–99)

## 2021-09-16 SURGERY — ARTHROPLASTY, KNEE, TOTAL
Anesthesia: General | Site: Knee | Laterality: Right

## 2021-09-16 MED ORDER — ROCURONIUM BROMIDE 100 MG/10ML IV SOLN
INTRAVENOUS | Status: DC | PRN
  Administered 2021-09-16: 60 mg via INTRAVENOUS

## 2021-09-16 MED ORDER — BUPIVACAINE-EPINEPHRINE (PF) 0.25% -1:200000 IJ SOLN
INTRAMUSCULAR | Status: AC
  Filled 2021-09-16: qty 30

## 2021-09-16 MED ORDER — CEFAZOLIN SODIUM-DEXTROSE 2-4 GM/100ML-% IV SOLN
INTRAVENOUS | Status: AC
  Administered 2021-09-16: 2 g via INTRAVENOUS
  Filled 2021-09-16: qty 100

## 2021-09-16 MED ORDER — HYDROXYZINE HCL 10 MG PO TABS
10.0000 mg | ORAL_TABLET | Freq: Three times a day (TID) | ORAL | Status: DC | PRN
Start: 2021-09-16 — End: 2021-09-21

## 2021-09-16 MED ORDER — OXYCODONE HCL 5 MG PO TABS
5.0000 mg | ORAL_TABLET | Freq: Once | ORAL | Status: AC
  Administered 2021-09-16: 5 mg via ORAL

## 2021-09-16 MED ORDER — ORAL CARE MOUTH RINSE: 15.0000 mL | Freq: Once | OROMUCOSAL | Status: AC

## 2021-09-16 MED ORDER — SODIUM CHLORIDE FLUSH 0.9 % IV SOLN
INTRAVENOUS | Status: AC
  Filled 2021-09-16: qty 40

## 2021-09-16 MED ORDER — PHENYLEPHRINE HCL (PRESSORS) 10 MG/ML IV SOLN
INTRAVENOUS | Status: DC | PRN
  Administered 2021-09-16: 80 ug via INTRAVENOUS
  Administered 2021-09-16 (×2): 160 ug via INTRAVENOUS

## 2021-09-16 MED ORDER — ALUM & MAG HYDROXIDE-SIMETH 200-200-20 MG/5ML PO SUSP: 30.0000 mL | ORAL | Status: DC | PRN

## 2021-09-16 MED ORDER — KETAMINE HCL 10 MG/ML IJ SOLN
INTRAMUSCULAR | Status: DC | PRN
  Administered 2021-09-16: 30 mg via INTRAVENOUS

## 2021-09-16 MED ORDER — SODIUM CHLORIDE 0.9 % IV SOLN: INTRAVENOUS | Status: DC

## 2021-09-16 MED ORDER — OXYCODONE HCL 5 MG PO TABS
5.0000 mg | ORAL_TABLET | Freq: Once | ORAL | Status: AC | PRN
  Administered 2021-09-16: 5 mg via ORAL

## 2021-09-16 MED ORDER — SUGAMMADEX SODIUM 200 MG/2ML IV SOLN
INTRAVENOUS | Status: DC | PRN
  Administered 2021-09-16: 200 mg via INTRAVENOUS

## 2021-09-16 MED ORDER — ONDANSETRON HCL 4 MG/2ML IJ SOLN
INTRAMUSCULAR | Status: AC
  Filled 2021-09-16: qty 2

## 2021-09-16 MED ORDER — HYDRALAZINE HCL 25 MG PO TABS
25.0000 mg | ORAL_TABLET | Freq: Three times a day (TID) | ORAL | Status: DC
  Administered 2021-09-16 – 2021-09-21 (×14): 25 mg via ORAL
  Filled 2021-09-16 (×15): qty 1

## 2021-09-16 MED ORDER — LIDOCAINE HCL (CARDIAC) PF 100 MG/5ML IV SOSY
PREFILLED_SYRINGE | INTRAVENOUS | Status: DC | PRN
  Administered 2021-09-16: 100 mg via INTRAVENOUS

## 2021-09-16 MED ORDER — FENTANYL CITRATE (PF) 100 MCG/2ML IJ SOLN
INTRAMUSCULAR | Status: DC | PRN
  Administered 2021-09-16: 50 ug via INTRAVENOUS

## 2021-09-16 MED ORDER — ACETAMINOPHEN 10 MG/ML IV SOLN
INTRAVENOUS | Status: DC | PRN
  Administered 2021-09-16: 1000 mg via INTRAVENOUS

## 2021-09-16 MED ORDER — LIDOCAINE HCL (PF) 2 % IJ SOLN
INTRAMUSCULAR | Status: AC
  Filled 2021-09-16: qty 5

## 2021-09-16 MED ORDER — METHOCARBAMOL 500 MG PO TABS
ORAL_TABLET | ORAL | Status: AC
  Administered 2021-09-16: 500 mg via ORAL
  Filled 2021-09-16: qty 1

## 2021-09-16 MED ORDER — PHENOL 1.4 % MT LIQD: 1.0000 | OROMUCOSAL | Status: DC | PRN

## 2021-09-16 MED ORDER — MORPHINE SULFATE (PF) 2 MG/ML IV SOLN: 0.5000 mg | INTRAVENOUS | Status: DC | PRN

## 2021-09-16 MED ORDER — ONDANSETRON HCL 4 MG PO TABS: 4.0000 mg | ORAL_TABLET | Freq: Four times a day (QID) | ORAL | Status: DC | PRN

## 2021-09-16 MED ORDER — SENNOSIDES-DOCUSATE SODIUM 8.6-50 MG PO TABS: 1.0000 | ORAL_TABLET | Freq: Every evening | ORAL | Status: DC | PRN

## 2021-09-16 MED ORDER — FENTANYL CITRATE (PF) 100 MCG/2ML IJ SOLN
INTRAMUSCULAR | Status: AC
  Filled 2021-09-16: qty 2

## 2021-09-16 MED ORDER — DOCUSATE SODIUM 100 MG PO CAPS
100.0000 mg | ORAL_CAPSULE | Freq: Two times a day (BID) | ORAL | Status: DC
  Administered 2021-09-16 – 2021-09-21 (×10): 100 mg via ORAL
  Filled 2021-09-16 (×10): qty 1

## 2021-09-16 MED ORDER — 0.9 % SODIUM CHLORIDE (POUR BTL) OPTIME
TOPICAL | Status: DC | PRN
  Administered 2021-09-16: 1000 mL

## 2021-09-16 MED ORDER — ACETAMINOPHEN 325 MG PO TABS
325.0000 mg | ORAL_TABLET | Freq: Four times a day (QID) | ORAL | Status: DC | PRN
  Administered 2021-09-17: 650 mg via ORAL
  Filled 2021-09-16: qty 2

## 2021-09-16 MED ORDER — METFORMIN HCL 500 MG PO TABS
1000.0000 mg | ORAL_TABLET | Freq: Two times a day (BID) | ORAL | Status: DC
  Administered 2021-09-16 – 2021-09-21 (×10): 1000 mg via ORAL
  Filled 2021-09-16 (×10): qty 2

## 2021-09-16 MED ORDER — MENTHOL 3 MG MT LOZG
1.0000 | LOZENGE | OROMUCOSAL | Status: DC | PRN
Start: 2021-09-16 — End: 2021-09-21
  Filled 2021-09-16: qty 9

## 2021-09-16 MED ORDER — BUPIVACAINE LIPOSOME 1.3 % IJ SUSP
INTRAMUSCULAR | Status: DC | PRN
  Administered 2021-09-16: 92 mL via INTRA_ARTICULAR

## 2021-09-16 MED ORDER — TRAZODONE HCL 50 MG PO TABS
50.0000 mg | ORAL_TABLET | Freq: Every day | ORAL | Status: DC
  Administered 2021-09-16 – 2021-09-20 (×5): 50 mg via ORAL
  Filled 2021-09-16 (×5): qty 1

## 2021-09-16 MED ORDER — CLOPIDOGREL BISULFATE 75 MG PO TABS
75.0000 mg | ORAL_TABLET | Freq: Every day | ORAL | Status: DC
  Administered 2021-09-17 – 2021-09-21 (×5): 75 mg via ORAL
  Filled 2021-09-16 (×5): qty 1

## 2021-09-16 MED ORDER — METOCLOPRAMIDE HCL 5 MG PO TABS: 5.0000 mg | ORAL_TABLET | Freq: Three times a day (TID) | ORAL | Status: DC | PRN

## 2021-09-16 MED ORDER — LIRAGLUTIDE 18 MG/3ML ~~LOC~~ SOPN
1.8000 mg | PEN_INJECTOR | SUBCUTANEOUS | Status: DC
Start: 2021-09-17 — End: 2021-09-17

## 2021-09-16 MED ORDER — PANTOPRAZOLE SODIUM 40 MG PO TBEC
40.0000 mg | DELAYED_RELEASE_TABLET | Freq: Every day | ORAL | Status: DC
  Administered 2021-09-16 – 2021-09-21 (×6): 40 mg via ORAL
  Filled 2021-09-16 (×6): qty 1

## 2021-09-16 MED ORDER — BISACODYL 5 MG PO TBEC
5.0000 mg | DELAYED_RELEASE_TABLET | Freq: Every day | ORAL | Status: DC | PRN
  Administered 2021-09-20: 5 mg via ORAL
  Filled 2021-09-16: qty 1

## 2021-09-16 MED ORDER — CHLORTHALIDONE 25 MG PO TABS
25.0000 mg | ORAL_TABLET | Freq: Every day | ORAL | Status: DC
  Administered 2021-09-17 – 2021-09-21 (×5): 25 mg via ORAL
  Filled 2021-09-16 (×7): qty 1

## 2021-09-16 MED ORDER — BUPIVACAINE LIPOSOME 1.3 % IJ SUSP
INTRAMUSCULAR | Status: AC
  Filled 2021-09-16: qty 20

## 2021-09-16 MED ORDER — ONDANSETRON HCL 4 MG/2ML IJ SOLN
INTRAMUSCULAR | Status: DC | PRN
  Administered 2021-09-16: 4 mg via INTRAVENOUS

## 2021-09-16 MED ORDER — ROCURONIUM BROMIDE 10 MG/ML (PF) SYRINGE
PREFILLED_SYRINGE | INTRAVENOUS | Status: AC
  Filled 2021-09-16: qty 10

## 2021-09-16 MED ORDER — HYDROCODONE-ACETAMINOPHEN 5-325 MG PO TABS
1.0000 | ORAL_TABLET | ORAL | Status: DC | PRN
  Administered 2021-09-18: 2 via ORAL
  Administered 2021-09-18: 1 via ORAL
  Filled 2021-09-16: qty 2
  Filled 2021-09-16: qty 1

## 2021-09-16 MED ORDER — METOCLOPRAMIDE HCL 5 MG/ML IJ SOLN: 5.0000 mg | Freq: Three times a day (TID) | INTRAMUSCULAR | Status: DC | PRN

## 2021-09-16 MED ORDER — SODIUM CHLORIDE 0.9 % IR SOLN
Status: DC | PRN
  Administered 2021-09-16: 3000 mL

## 2021-09-16 MED ORDER — MIDAZOLAM HCL 2 MG/2ML IJ SOLN
INTRAMUSCULAR | Status: DC | PRN
  Administered 2021-09-16: 2 mg via INTRAVENOUS

## 2021-09-16 MED ORDER — FENTANYL CITRATE (PF) 100 MCG/2ML IJ SOLN
25.0000 ug | INTRAMUSCULAR | Status: DC | PRN
  Administered 2021-09-16: 50 ug via INTRAVENOUS
  Administered 2021-09-16: 25 ug via INTRAVENOUS
  Administered 2021-09-16: 50 ug via INTRAVENOUS

## 2021-09-16 MED ORDER — CEFAZOLIN SODIUM-DEXTROSE 2-4 GM/100ML-% IV SOLN
2.0000 g | Freq: Four times a day (QID) | INTRAVENOUS | Status: AC
  Administered 2021-09-17: 2 g via INTRAVENOUS
  Filled 2021-09-16 (×2): qty 100

## 2021-09-16 MED ORDER — KETAMINE HCL 50 MG/5ML IJ SOSY
PREFILLED_SYRINGE | INTRAMUSCULAR | Status: AC
  Filled 2021-09-16: qty 5

## 2021-09-16 MED ORDER — MIDAZOLAM HCL 2 MG/2ML IJ SOLN
INTRAMUSCULAR | Status: AC
  Filled 2021-09-16: qty 2

## 2021-09-16 MED ORDER — ASPIRIN 81 MG PO CHEW
81.0000 mg | CHEWABLE_TABLET | Freq: Two times a day (BID) | ORAL | Status: DC
  Administered 2021-09-16 – 2021-09-21 (×10): 81 mg via ORAL
  Filled 2021-09-16 (×10): qty 1

## 2021-09-16 MED ORDER — HYDROMORPHONE HCL 1 MG/ML IJ SOLN
0.5000 mg | INTRAMUSCULAR | Status: DC | PRN
  Administered 2021-09-16: 0.5 mg via INTRAVENOUS

## 2021-09-16 MED ORDER — INSULIN GLARGINE-YFGN 100 UNIT/ML ~~LOC~~ SOLN
25.0000 [IU] | Freq: Every day | SUBCUTANEOUS | Status: DC
  Administered 2021-09-16 – 2021-09-20 (×5): 25 [IU] via SUBCUTANEOUS
  Filled 2021-09-16 (×6): qty 0.25

## 2021-09-16 MED ORDER — CEFAZOLIN SODIUM-DEXTROSE 2-4 GM/100ML-% IV SOLN
2.0000 g | INTRAVENOUS | Status: AC
  Administered 2021-09-16: 2 g via INTRAVENOUS

## 2021-09-16 MED ORDER — TRANEXAMIC ACID-NACL 1000-0.7 MG/100ML-% IV SOLN
INTRAVENOUS | Status: DC | PRN
  Administered 2021-09-16: 1000 mg via INTRAVENOUS

## 2021-09-16 MED ORDER — CHLORHEXIDINE GLUCONATE 0.12 % MT SOLN
OROMUCOSAL | Status: AC
  Administered 2021-09-16: 15 mL via OROMUCOSAL
  Filled 2021-09-16: qty 15

## 2021-09-16 MED ORDER — SURGIRINSE WOUND IRRIGATION SYSTEM - OPTIME
TOPICAL | Status: DC | PRN
  Administered 2021-09-16: 900 mL via TOPICAL

## 2021-09-16 MED ORDER — NEOMYCIN-POLYMYXIN B GU 40-200000 IR SOLN
Status: DC | PRN
  Administered 2021-09-16: 4 mL

## 2021-09-16 MED ORDER — OXYCODONE HCL 5 MG PO TABS
ORAL_TABLET | ORAL | Status: AC
  Filled 2021-09-16: qty 2

## 2021-09-16 MED ORDER — ATORVASTATIN CALCIUM 20 MG PO TABS
40.0000 mg | ORAL_TABLET | Freq: Every day | ORAL | Status: DC
  Administered 2021-09-17 – 2021-09-21 (×5): 40 mg via ORAL
  Filled 2021-09-16 (×5): qty 2

## 2021-09-16 MED ORDER — MORPHINE SULFATE (PF) 10 MG/ML IV SOLN
INTRAVENOUS | Status: AC
  Filled 2021-09-16: qty 1

## 2021-09-16 MED ORDER — HYDROCODONE-ACETAMINOPHEN 7.5-325 MG PO TABS
1.0000 | ORAL_TABLET | ORAL | Status: DC | PRN
  Administered 2021-09-16 – 2021-09-19 (×5): 2 via ORAL
  Filled 2021-09-16 (×5): qty 2

## 2021-09-16 MED ORDER — GABAPENTIN 300 MG PO CAPS
300.0000 mg | ORAL_CAPSULE | Freq: Two times a day (BID) | ORAL | Status: DC
  Administered 2021-09-16 – 2021-09-21 (×10): 300 mg via ORAL
  Filled 2021-09-16 (×10): qty 1

## 2021-09-16 MED ORDER — CHLORHEXIDINE GLUCONATE 0.12 % MT SOLN: 15.0000 mL | Freq: Once | OROMUCOSAL | Status: AC

## 2021-09-16 MED ORDER — PROPOFOL 10 MG/ML IV BOLUS
INTRAVENOUS | Status: DC | PRN
  Administered 2021-09-16: 150 mg via INTRAVENOUS
  Administered 2021-09-16: 50 mg via INTRAVENOUS

## 2021-09-16 MED ORDER — METHOCARBAMOL 500 MG PO TABS
500.0000 mg | ORAL_TABLET | Freq: Four times a day (QID) | ORAL | Status: DC | PRN
Start: 2021-09-16 — End: 2021-09-21
  Administered 2021-09-16 – 2021-09-18 (×3): 500 mg via ORAL
  Filled 2021-09-16 (×3): qty 1

## 2021-09-16 MED ORDER — FENTANYL CITRATE (PF) 100 MCG/2ML IJ SOLN
INTRAMUSCULAR | Status: AC
  Administered 2021-09-16: 25 ug via INTRAVENOUS
  Filled 2021-09-16: qty 2

## 2021-09-16 MED ORDER — MAGNESIUM HYDROXIDE 400 MG/5ML PO SUSP
30.0000 mL | Freq: Every day | ORAL | Status: DC
  Administered 2021-09-16 – 2021-09-20 (×5): 30 mL via ORAL
  Filled 2021-09-16 (×5): qty 30

## 2021-09-16 MED ORDER — AMLODIPINE BESYLATE 10 MG PO TABS
10.0000 mg | ORAL_TABLET | Freq: Every day | ORAL | Status: DC
  Administered 2021-09-17 – 2021-09-21 (×5): 10 mg via ORAL
  Filled 2021-09-16 (×5): qty 1

## 2021-09-16 MED ORDER — TRAMADOL HCL 50 MG PO TABS
50.0000 mg | ORAL_TABLET | Freq: Four times a day (QID) | ORAL | Status: DC
  Administered 2021-09-16 – 2021-09-21 (×20): 50 mg via ORAL
  Filled 2021-09-16 (×20): qty 1

## 2021-09-16 MED ORDER — FLEET ENEMA 7-19 GM/118ML RE ENEM
1.0000 | ENEMA | Freq: Once | RECTAL | Status: DC | PRN
Start: 2021-09-16 — End: 2021-09-21

## 2021-09-16 MED ORDER — ONDANSETRON HCL 4 MG/2ML IJ SOLN: 4.0000 mg | Freq: Four times a day (QID) | INTRAMUSCULAR | Status: DC | PRN

## 2021-09-16 MED ORDER — NEOMYCIN-POLYMYXIN B GU 40-200000 IR SOLN
Status: DC | PRN
  Administered 2021-09-16: 3012 mL

## 2021-09-16 MED ORDER — ZOLPIDEM TARTRATE 5 MG PO TABS: 5.0000 mg | ORAL_TABLET | Freq: Every evening | ORAL | Status: DC | PRN

## 2021-09-16 MED ORDER — CARVEDILOL 25 MG PO TABS
25.0000 mg | ORAL_TABLET | Freq: Two times a day (BID) | ORAL | Status: DC
  Administered 2021-09-16 – 2021-09-21 (×10): 25 mg via ORAL
  Filled 2021-09-16 (×10): qty 1

## 2021-09-16 MED ORDER — OXYCODONE HCL 5 MG/5ML PO SOLN: 5.0000 mg | Freq: Once | ORAL | Status: AC | PRN

## 2021-09-16 MED ORDER — PROPOFOL 10 MG/ML IV BOLUS
INTRAVENOUS | Status: AC
  Filled 2021-09-16: qty 20

## 2021-09-16 MED ORDER — HYDROMORPHONE HCL 1 MG/ML IJ SOLN
INTRAMUSCULAR | Status: AC
  Filled 2021-09-16: qty 1

## 2021-09-16 MED ORDER — INSULIN ASPART 100 UNIT/ML IJ SOLN
0.0000 [IU] | Freq: Three times a day (TID) | INTRAMUSCULAR | Status: DC
  Administered 2021-09-17: 5 [IU] via SUBCUTANEOUS
  Administered 2021-09-17 (×2): 3 [IU] via SUBCUTANEOUS
  Administered 2021-09-18 (×3): 2 [IU] via SUBCUTANEOUS
  Administered 2021-09-19 (×2): 3 [IU] via SUBCUTANEOUS
  Administered 2021-09-20 (×2): 2 [IU] via SUBCUTANEOUS
  Filled 2021-09-16 (×7): qty 1

## 2021-09-16 SURGICAL SUPPLY — 70 items
BLADE SAGITTAL 25.0X1.19X90 (BLADE) ×2 IMPLANT
BLADE SAW 90X13X1.19 OSCILLAT (BLADE) ×2 IMPLANT
BLOCK CUTTING FEMUR 5 RT MED (MISCELLANEOUS) ×1 IMPLANT
BLOCK CUTTING TIBIAL 4 RT (MISCELLANEOUS) ×1 IMPLANT
BLOCK CUTTING TIBIAL 4 RT MIS (MISCELLANEOUS) ×1 IMPLANT
BNDG ELASTIC 6X5.8 VLCR STR LF (GAUZE/BANDAGES/DRESSINGS) ×2 IMPLANT
CANISTER WOUND CARE 500ML ATS (WOUND CARE) ×2 IMPLANT
CEMENT HV SMART SET (Cement) ×4 IMPLANT
CHLORAPREP W/TINT 26 (MISCELLANEOUS) ×4 IMPLANT
COOLER POLAR GLACIER W/PUMP (MISCELLANEOUS) ×2 IMPLANT
CUFF TOURN SGL QUICK 24 (TOURNIQUET CUFF)
CUFF TOURN SGL QUICK 34 (TOURNIQUET CUFF)
CUFF TRNQT CYL 24X4X16.5-23 (TOURNIQUET CUFF) IMPLANT
CUFF TRNQT CYL 34X4.125X (TOURNIQUET CUFF) IMPLANT
DRAPE 3/4 80X56 (DRAPES) ×4 IMPLANT
DRSG MEPILEX SACRM 8.7X9.8 (GAUZE/BANDAGES/DRESSINGS) ×2 IMPLANT
ELECT CAUTERY BLADE 6.4 (BLADE) ×2 IMPLANT
ELECT REM PT RETURN 9FT ADLT (ELECTROSURGICAL) ×2
ELECTRODE REM PT RTRN 9FT ADLT (ELECTROSURGICAL) ×1 IMPLANT
FEMORAL COMP CEMENTED SZ5 (Femur) ×1 IMPLANT
FEMUR BONE MODEL (MISCELLANEOUS) ×1 IMPLANT
GAUZE 4X4 16PLY ~~LOC~~+RFID DBL (SPONGE) ×2 IMPLANT
GAUZE XEROFORM 1X8 LF (GAUZE/BANDAGES/DRESSINGS) ×2 IMPLANT
GLOVE BIOGEL PI IND STRL 9 (GLOVE) ×1 IMPLANT
GLOVE BIOGEL PI INDICATOR 9 (GLOVE) ×1
GLOVE SURG ORTHO 8.0 STRL STRW (GLOVE) ×2 IMPLANT
GLOVE SURG SYN 9.0  PF PI (GLOVE) ×1
GLOVE SURG SYN 9.0 PF PI (GLOVE) ×1 IMPLANT
GLOVE SURG UNDER LTX SZ8 (GLOVE) ×2 IMPLANT
GOWN SRG 2XL LVL 4 RGLN SLV (GOWNS) ×1 IMPLANT
GOWN STRL NON-REIN 2XL LVL4 (GOWNS) ×1
GOWN STRL REUS W/ TWL LRG LVL3 (GOWN DISPOSABLE) ×1 IMPLANT
GOWN STRL REUS W/ TWL XL LVL3 (GOWN DISPOSABLE) ×1 IMPLANT
GOWN STRL REUS W/TWL LRG LVL3 (GOWN DISPOSABLE) ×1
GOWN STRL REUS W/TWL XL LVL3 (GOWN DISPOSABLE) ×1
HOLDER FOLEY CATH W/STRAP (MISCELLANEOUS) ×2 IMPLANT
HOOD PEEL AWAY FLYTE STAYCOOL (MISCELLANEOUS) ×4 IMPLANT
IV NS IRRIG 3000ML ARTHROMATIC (IV SOLUTION) ×2 IMPLANT
KIT PREVENA INCISION MGT20CM45 (CANNISTER) ×2 IMPLANT
KIT TURNOVER KIT A (KITS) ×2 IMPLANT
MANIFOLD NEPTUNE II (INSTRUMENTS) ×4 IMPLANT
NDL SAFETY ECLIPSE 18X1.5 (NEEDLE) ×1 IMPLANT
NDL SPNL 20GX3.5 QUINCKE YW (NEEDLE) ×1 IMPLANT
NEEDLE HYPO 18GX1.5 SHARP (NEEDLE) ×1
NEEDLE SPNL 20GX3.5 QUINCKE YW (NEEDLE) ×2 IMPLANT
NS IRRIG 1000ML POUR BTL (IV SOLUTION) ×2 IMPLANT
PACK TOTAL KNEE (MISCELLANEOUS) ×2 IMPLANT
PAD WRAPON POLAR KNEE (MISCELLANEOUS) ×1 IMPLANT
PATELLA RESURFACING MEDACTA SZ (Bone Implant) ×1 IMPLANT
PULSAVAC PLUS IRRIG FAN TIP (DISPOSABLE) ×2
SCALPEL PROTECTED #10 DISP (BLADE) ×4 IMPLANT
SOLUTION IRRIG SURGIPHOR (IV SOLUTION) ×2 IMPLANT
STAPLER SKIN PROX 35W (STAPLE) ×3 IMPLANT
SUCTION FRAZIER HANDLE 10FR (MISCELLANEOUS) ×1
SUCTION TUBE FRAZIER 10FR DISP (MISCELLANEOUS) ×1 IMPLANT
SUT DVC 2 QUILL PDO  T11 36X36 (SUTURE) ×1
SUT DVC 2 QUILL PDO T11 36X36 (SUTURE) ×1 IMPLANT
SUT ETHIBOND 2 V 37 (SUTURE) IMPLANT
SUT V-LOC 90 ABS DVC 3-0 CL (SUTURE) ×2 IMPLANT
SYR 20ML LL LF (SYRINGE) ×2 IMPLANT
SYR 50ML LL SCALE MARK (SYRINGE) ×4 IMPLANT
TIBIAL INSERT SZ4 LT 02120410F (Insert) ×1 IMPLANT
TIBIAL TRAY FIXED 4 MEDACTA 02 (Joint) ×1 IMPLANT
TIP FAN IRRIG PULSAVAC PLUS (DISPOSABLE) ×1 IMPLANT
TOWEL OR 17X26 4PK STRL BLUE (TOWEL DISPOSABLE) ×2 IMPLANT
TOWER CARTRIDGE SMART MIX (DISPOSABLE) ×2 IMPLANT
TRAP FLUID SMOKE EVACUATOR (MISCELLANEOUS) ×2 IMPLANT
TRAY FOLEY MTR SLVR 16FR STAT (SET/KITS/TRAYS/PACK) ×2 IMPLANT
WATER STERILE IRR 1000ML POUR (IV SOLUTION) ×2 IMPLANT
WRAPON POLAR PAD KNEE (MISCELLANEOUS) ×2

## 2021-09-16 NOTE — H&P (Signed)
Chief Complaint  Patient presents with   Right Knee - Follow-up, Pain    History of the Present Illness: Richard Stark is a 71 y.o. male here today.   The patient presents for H and P for a right total knee arthroplasty scheduled for 09/16/2021. He has had a prior left total knee arthroplasty. He has failed nonoperative measures including injections, bracing, and therapy to date. 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 He has had a minor CT done as well.  The patient states his left knee is doing well. He has occasional left knee pain.  The patient states he had a stroke. He takes Plavix and Jardiance.  I have reviewed past medical, surgical, social and family history, and allergies as documented in the EMR.  Past Medical History: Past Medical History:  Diagnosis Date   Chronic kidney disease 05/07/2009   CVA (cerebral vascular accident) (CMS-HCC) 05/15/2012  Last Assessment & Plan: Formatting of this note might be different from the original. Patient with hand pain s/p stroke. Has seen [facility] in the past. Will refer back to them for help with exercises and stretches that may help his discomfort. -- Instructed patient that we do not want to increase dose of Tramadol any further -- Can take 2 Tylenol tablets (1000 mg) 3 times a day   Diabetes mellitus without complication (CMS-HCC)   Drug-induced erectile dysfunction 11/24/2015   History of stroke   Hyperlipemia 02/17/2012   Hyperlipidemia   Hypertension   Hypertension, benign 05/07/2009  Last Assessment & Plan: Formatting of this note might be different from the original. BP 141/71 today. Continue current regimen.   Left shoulder pain 01/24/2017   Primary localized osteoarthritis of knees, bilateral 07/07/2010   Tobacco abuse 11/24/2015    Type 2 diabetes mellitus, with long-term current use of insulin (CMS-HCC) 05/07/2009  Last Assessment & Plan: Formatting of this note might be different from the original. A1C 6.0 today. Excellent control. -- Continue Lantus 30 u nightly -- Continue metformin   Past Surgical History: Past Surgical History:  Procedure Laterality Date   ARTHROPLASTY TOTAL KNEE Left 04/28/2020  Dr. Rosita Kea   Past Family History: Family History  Family history unknown: Yes   Medications: Current Outpatient Medications Ordered in Epic  Medication Sig Dispense Refill   acetaminophen (TYLENOL) 500 MG tablet Take 1 tablet by mouth every 8 (eight) hours as needed   clopidogreL (PLAVIX) 75 mg tablet Take by mouth   docusate (COLACE) 100 MG capsule Take 1 capsule by mouth 2 (two) times daily   famotidine (PEPCID) 40 MG tablet Take 40 mg by mouth every evening   gabapentin (NEURONTIN) 300 MG capsule Take 300mg  (1 tablet) three times daily.   HYDROcodone-acetaminophen (NORCO) 7.5-325 mg tablet Take 1 tablet by mouth every 8 (eight) hours as needed for Pain 25 tablet 0   hydrOXYzine HCL (ATARAX) 10 MG tablet Take 10 mg by mouth 3 (three) times daily as needed   JARDIANCE 25 mg tablet Take 25 mg by mouth once daily   lidocaine (LIDODERM) 5 % patch Apply to affected area for 12 hours only each day (then remove patch)   metFORMIN (GLUCOPHAGE) 1000 MG tablet Take by mouth   methocarbamoL (ROBAXIN) 500 MG tablet Take 1 tablet by mouth every 6 (six) hours as needed  nicotine (NICODERM CQ) 7 mg/24 hr patch Place onto the skin   traMADoL (ULTRAM) 50 mg tablet Take 1 tablet (50 mg total) by mouth every 4 (four) hours as needed 30 tablet 0   traZODone (DESYREL) 50 MG tablet Take 50 mg by mouth at bedtime   amLODIPine (NORVASC) 10 MG tablet Take 1 tablet by mouth once daily   atorvastatin (LIPITOR) 40 MG tablet Take 40 mg by mouth once daily   carvediloL (COREG) 25 MG tablet Take by mouth   chlorthalidone 25 MG tablet Take 25  mg by mouth every morning   hydrALAZINE (APRESOLINE) 25 MG tablet Take by mouth   insulin GLARGINE (LANTUS SOLOSTAR U-100 INSULIN) pen injector (concentration 100 units/mL) Inject subcutaneously   liraglutide (VICTOZA) 0.6 mg/0.1 mL (18 mg/3 mL) pen injector Inject subcutaneously   No current Epic-ordered facility-administered medications on file.   Allergies: Allergies  Allergen Reactions   Lisinopril Anaphylaxis and Shortness Of Breath  Throat closes had to be tracheid.    Body mass index is 32.93 kg/m.  Review of Systems: A comprehensive 14 point ROS was performed, reviewed, and the pertinent orthopaedic findings are documented in the HPI.  Vitals:  09/13/21 1245  BP: 124/66    General Physical Examination:   General/Constitutional: No apparent distress: well-nourished and well developed. Eyes: Pupils equal, round with synchronous movement. Lungs: Clear to auscultation HEENT: Very missing teeth. Vascular: No edema, swelling or tenderness, except as noted in detailed exam. Cardiac: Heart rate and rhythm is regular. Integumentary: No impressive skin lesions present, except as noted in detailed exam. Neuro/Psych: Normal mood and affect, oriented to person, place and time.  On exam, right knee has mild effusion. Right knee range of motion of 5 to 105 degrees with crepitation medially and palpable osteophytes.  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  Assessment: ICD-10-CM  1. Primary osteoarthritis of right knee M17.11   Plan:  The patient has clinical findings of severe right knee osteoarthritis.  We discussed the patient's prior x-ray findings. I recommend right total knee arthroplasty. I explained the surgery and postoperative course in detail.  We will schedule the patient  for right total knee arthroplasty on 09/16/2021.  Surgical Risks:  The nature of the condition and the proposed procedure has been reviewed in detail with the patient. Surgical versus non-surgical options and prognosis for recovery have been reviewed and the inherent risks and benefits of each have been discussed including the risks of infection, bleeding, injury to nerves/blood vessels/tendons, incomplete relief of symptoms, persisting pain and/or stiffness, loss of function, complex regional pain syndrome, failure of the procedure, as appropriate.  Teeth: Several missing teeth  Document Attestation: I, Velu Ramaswamy, have reviewed and updated documentation for [facility], MD, utilizing Nuance DAX.    Electronically signed by Marlena Clipper, MD at 09/14/2021 9:21 PM EDT  Reviewed  H+P. No changes noted.

## 2021-09-16 NOTE — Transfer of Care (Signed)
Immediate Anesthesia Transfer of Care Note  Patient: Richard Stark  Procedure(s) Performed: TOTAL KNEE ARTHROPLASTY (Right: Knee)  Patient Location: PACU  Anesthesia Type:General  Level of Consciousness: awake, alert  and oriented  Airway & Oxygen Therapy: Patient Spontanous Breathing and Patient connected to nasal cannula oxygen  Post-op Assessment: Report given to RN and Post -op Vital signs reviewed and stable  Post vital signs: Reviewed and stable  Last Vitals:  Vitals Value Taken Time  BP 129/64 09/16/21 1501  Temp 36.2 C 09/16/21 1458  Pulse 74 09/16/21 1505  Resp 15 09/16/21 1505  SpO2 94 % 09/16/21 1505  Vitals shown include unvalidated device data.  Last Pain:  Vitals:   09/16/21 1458  TempSrc:   PainSc: Asleep         Complications: No notable events documented.

## 2021-09-16 NOTE — Progress Notes (Signed)
Pt pain is still 9/10 after Fentanyl. Dr. Ronni Rumble notified. Acknowledged. Orders received. See [facility]

## 2021-09-16 NOTE — Evaluation (Signed)
Physical Therapy Evaluation Patient Details Name: Richard Stark MRN: [id number] DOB: [birth year] Today's Date: 09/16/2021  History of Present Illness  71 y/o male s/p R TKA 8/10, h/o L TKA March 2022.  Clinical Impression  Pt with pain hesitancy t/o the eval, but despite this showed great effort and determination.  He self selects R hip ER/knee flexion educated on positioning (including bone foam).  Pt with some difficulty sustaining reps during some sets 2/2 pain but ultimately did SLRs, had >90 of flexion and managed to walk ~35 ft.  Pt clearly uncomfortable with most acts but hard working.     Recommendations for follow up therapy are one component of a multi-disciplinary discharge planning process, led by the attending physician.  Recommendations may be updated based on patient status, additional functional criteria and insurance authorization.  Follow Up Recommendations Skilled nursing-short term rehab (<3 hours/day) Can patient physically be transported by private vehicle: Yes    Assistance Recommended at Discharge Intermittent Supervision/Assistance  Patient can return home with the following  A little help with walking and/or transfers;A little help with bathing/dressing/bathroom;Assistance with cooking/housework;Help with stairs or ramp for entrance;Assist for transportation    Equipment Recommendations None recommended by PT  Recommendations for Other Services       Functional Status Assessment Patient has had a recent decline in their functional status and demonstrates the ability to make significant improvements in function in a reasonable and predictable amount of time.     Precautions / Restrictions Precautions Precautions: Fall;Knee Precaution Booklet Issued: Yes (comment) Precaution Comments: HEP Restrictions Weight Bearing Restrictions: Yes RLE Weight Bearing: Weight bearing as tolerated      Mobility  Bed Mobility Overal bed mobility: Needs Assistance Bed  Mobility: Supine to Sit     Supine to sit: Min assist     General bed mobility comments: after a few false starts pt was able to get to sitting EOB w/ only light HHA to get to sitting EOB    Transfers Overall transfer level: Needs assistance Equipment used: Rolling walker (2 wheels) Transfers: Sit to/from Stand Sit to Stand: Min assist, Mod assist           General transfer comment: Pt attempted, but was unable to rise w/o assist from standard height bed, despite cuing for set up and sequencing he did need direct assist to get to standing    Ambulation/Gait Ambulation/Gait assistance: Min assist Gait Distance (Feet): 35 Feet Assistive device: Rolling walker (2 wheels)         General Gait Details: Pt with slow, UE reliant gait, initially hesitant to put R heel down/take full WBing but did improve with increased distance.  Ultimatley fatigued and needing to sit  Stairs            Wheelchair Mobility    Modified Rankin (Stroke Patients Only)       Balance Overall balance assessment: Needs assistance Sitting-balance support: Bilateral upper extremity supported Sitting balance-Leahy Scale: Good     Standing balance support: Bilateral upper extremity supported Standing balance-Leahy Scale: Fair Standing balance comment: highly reliant on the walker                             Pertinent Vitals/Pain Pain Assessment Pain Assessment: Faces Faces Pain Scale: Hurts whole lot Pain Location: R knee    Home Living Family/patient expects to be discharged to:: Skilled nursing facility Living Arrangements: Other relatives (niece) Available Help at  Discharge: Family   Home Access: Stairs to enter Entrance Stairs-Rails: Left Entrance Stairs-Number of Steps: 2   Home Layout: Able to live on main level with bedroom/bathroom Home Equipment: Agricultural consultant (2 wheels);Cane - single point      Prior Function Prior Level of Function : Independent/Modified  Independent             Mobility Comments: Pt reports that he drives, runs errands, stays active w/o issue, using cane the last month due to knee pain       Hand Dominance        Extremity/Trunk Assessment   Upper Extremity Assessment Upper Extremity Assessment: Overall WFL for tasks assessed    Lower Extremity Assessment Lower Extremity Assessment:  (expected post-op R LE weakness, L grossly functional t/o)       Communication   Communication: No difficulties  Cognition Arousal/Alertness: Awake/alert Behavior During Therapy: Restless Overall Cognitive Status: Difficult to assess                                 General Comments: delayed and at times non sequitur, does not recall much at all from L TKA 1.5 years ago        General Comments General comments (skin integrity, edema, etc.): Pt showed good effort despite considerable pain with all activities involving R LE    Exercises Total Joint Exercises Ankle Circles/Pumps: AROM, 10 reps Quad Sets: Strengthening, 10 reps Heel Slides: AROM, 5 reps Hip ABduction/ADduction: Strengthening, 10 reps Straight Leg Raises: AROM, AAROM, 10 reps Knee Flexion: PROM, 5 reps Goniometric ROM: 2-95   Assessment/Plan    PT Assessment Patient needs continued PT services  PT Problem List Decreased strength;Decreased range of motion;Decreased activity tolerance;Decreased balance;Decreased mobility;Decreased cognition;Decreased knowledge of use of DME;Decreased safety awareness;Decreased knowledge of precautions;Pain       PT Treatment Interventions DME instruction;Gait training;Stair training;Functional mobility training;Therapeutic activities;Therapeutic exercise;Balance training;Neuromuscular re-education;Cognitive remediation;Patient/family education    PT Goals (Current goals can be found in the Care Plan section)  Acute Rehab PT Goals Patient Stated Goal: get backm to walking well PT Goal Formulation: With  patient Time For Goal Achievement: 09/29/21 Potential to Achieve Goals: Fair    Frequency BID     Co-evaluation               AM-PAC PT "6 Clicks" Mobility  Outcome Measure Help needed turning from your back to your side while in a flat bed without using bedrails?: A Little Help needed moving from lying on your back to sitting on the side of a flat bed without using bedrails?: A Little Help needed moving to and from a bed to a chair (including a wheelchair)?: A Little Help needed standing up from a chair using your arms (e.g., wheelchair or bedside chair)?: A Lot Help needed to walk in hospital room?: A Lot Help needed climbing 3-5 steps with a railing? : A Lot 6 Click Score: 15    End of Session Equipment Utilized During Treatment: Gait belt Activity Tolerance: Patient limited by pain Patient left: with chair alarm set;with call bell/phone within reach Nurse Communication: Mobility status PT Visit Diagnosis: Muscle weakness (generalized) (M62.81);Ataxic gait (R26.0);Pain Pain - Right/Left: Left Pain - part of body: Knee    Time: 1650-1726 PT Time Calculation (min) (ACUTE ONLY): 36 min   Charges:   PT Evaluation $PT Eval Low Complexity: 1 Low PT Treatments $Gait Training: 8-22 mins $Therapeutic  Exercise: 8-22 mins        Malachi Pro, DPT 09/16/2021, 5:57 PM

## 2021-09-16 NOTE — Anesthesia Preprocedure Evaluation (Signed)
Anesthesia Evaluation  Patient identified by MRN, date of birth, ID band Patient awake    Reviewed: Allergy & Precautions, NPO status , Patient's Chart, lab work & pertinent test results  History of Anesthesia Complications Negative for: history of anesthetic complications  Airway Mallampati: III  TM Distance: >3 FB Neck ROM: full    Dental  (+) Missing   Pulmonary neg pulmonary ROS, neg shortness of breath, neg COPD, Current Smoker and Patient abstained from smoking.,    Pulmonary exam normal        Cardiovascular hypertension, (-) angina(-) CAD, (-) Cardiac Stents and (-) CABG negative cardio ROS Normal cardiovascular exam(-) dysrhythmias      Neuro/Psych CVA negative psych ROS   GI/Hepatic negative GI ROS, Neg liver ROS,   Endo/Other  negative endocrine ROSdiabetes  Renal/GU      Musculoskeletal   Abdominal   Peds  Hematology negative hematology ROS (+)   Anesthesia Other Findings Patient taking victoza this morning. Discussed the risks of aspiration while taking GLP-1. Discussed rescheduling or ETT and patient stated he wanted to proceed with the procedure with an ETT.    Past Medical History: No date: Arthritis No date: Diabetes mellitus without complication (HCC) No date: GERD (gastroesophageal reflux disease)     Comment:  OCC NO MEDS No date: Hypertension 2008: Stroke [facility])     Comment:  RIGHT ARM WEAKNESS  Past Surgical History: No date: COLONOSCOPY 04/28/2020: TOTAL KNEE ARTHROPLASTY; Left     Comment:  Procedure: TOTAL KNEE ARTHROPLASTY;  Surgeon: Kennedy Bucker, MD;  Location: [facility] ORS;  Service: Orthopedics;               Laterality: Left; No date: TRACHEOSTOMY     Comment:  DUE TO REACTION FROM LISNOPRIL  BMI    Body Mass Index: 32.52 kg/m      Reproductive/Obstetrics negative OB ROS                             Anesthesia Physical Anesthesia  Plan  ASA: 3  Anesthesia Plan: General ETT   Post-op Pain Management:    Induction: Intravenous  PONV Risk Score and Plan: Ondansetron, Dexamethasone, Midazolam and Treatment may vary due to age or medical condition  Airway Management Planned: Oral ETT  Additional Equipment:   Intra-op Plan:   Post-operative Plan: Extubation in OR  Informed Consent: I have reviewed the patients History and Physical, chart, labs and discussed the procedure including the risks, benefits and alternatives for the proposed anesthesia with the patient or authorized representative who has indicated his/her understanding and acceptance.     Dental Advisory Given  Plan Discussed with: Anesthesiologist, CRNA and Surgeon  Anesthesia Plan Comments: (Patient consented for risks of anesthesia including but not limited to:  - adverse reactions to medications - damage to eyes, teeth, lips or other oral mucosa - nerve damage due to positioning  - sore throat or hoarseness - Damage to heart, brain, nerves, lungs, other parts of body or loss of life  Patient voiced understanding.)        Anesthesia Quick Evaluation

## 2021-09-16 NOTE — Anesthesia Procedure Notes (Signed)
Procedure Name: Intubation Date/Time: 09/16/2021 1:12 PM  Performed by: Reece Agar, CRNAPre-anesthesia Checklist: Patient identified, Emergency Drugs available, Suction available and Patient being monitored Patient Re-evaluated:Patient Re-evaluated prior to induction Oxygen Delivery Method: Circle system utilized Preoxygenation: Pre-oxygenation with 100% oxygen Induction Type: IV induction Ventilation: Mask ventilation without difficulty Laryngoscope Size: McGraph and 4 Grade View: Grade I Tube type: Oral Tube size: 7.0 mm Number of attempts: 1 Airway Equipment and Method: Stylet and Oral airway Placement Confirmation: ETT inserted through vocal cords under direct vision, positive ETCO2 and breath sounds checked- equal and bilateral Secured at: 22 cm Tube secured with: Tape Dental Injury: Teeth and Oropharynx as per pre-operative assessment

## 2021-09-16 NOTE — Op Note (Signed)
09/16/2021  2:47 PM  PATIENT:  Richard Stark   MRN: [id number]  PRE-OPERATIVE DIAGNOSIS:  Primary localized osteoarthritis of right knee   POST-OPERATIVE DIAGNOSIS:  Same   PROCEDURE:  Procedure(s): Right TOTAL KNEE ARTHROPLASTY   SURGEON: Leitha Schuller, MD   ASSISTANTS: Cranston Neighbor, PA-C   ANESTHESIA:   general   EBL: 150   BLOOD ADMINISTERED:none   DRAINS:  Incisional wound VAC     LOCAL MEDICATIONS USED:  MARCAINE    and OTHER Toradol morphine and Exparel   SPECIMEN:  No Specimen   DISPOSITION OF SPECIMEN:  N/A   COUNTS:  YES   TOURNIQUET: 21 minutes at 300 mm Hg   IMPLANTS: Medacta  GMK sphere system with 5 right femur, 4 right tibia and 10 mm insert.  Size 3 patella, all components cemented.   DICTATION: Reubin Milan Dictation   patient was brought to the operating room and spinal anesthesia was obtained.  After prepping and draping the right leg in sterile fashion, and after patient identification and timeout procedures were completed, midline skin incision was made followed by medial parapatellar arthrotomy with severe medial compartment osteoarthritis, severe patellofemoral arthritis and moderate lateral compartment arthritis, partial synovectomy was also carried out.   The ACL and PCL and fat pad were excised along with anterior horns of the meniscus. The proximal tibia cutting guide from  the [facility] system was applied and the proximal tibia cut carried out.  The distal femoral cut was carried out in a similar fashion     The 5 femoral cutting guide applied with anterior posterior and chamfer cuts made.  The posterior horns of the menisci were removed at this point.   Injection of the above medication was carried out after the femoral and tibial cuts were carried out.  The 4 right baseplate trial was placed pinned into position and proximal tibial preparation carried out with drilling hand reaming and the keel punch followed by placement of the 5 femur and sizing the  tibial insert size 10 millimeter gave the best fit with stability and full extension.  The distal femoral drill holes were made in the notch cut for the trochlear groove was then carried out with trials were then removed the patella was cut using the patellar cutting guide and it sized to a size 3 after drill holes have been made tourniquet was raised at this point.  The knee was irrigated with pulsatile lavage and the bony surfaces dried the tibial component was cemented into place first.  Excess cement was removed and the polyethylene insert placed with a torque screw placed with a torque screwdriver tightened.  The distal femoral component was placed and the knee was held in extension as the patellar button was clamped into place.  After the cement was set, excess cement was removed and the knee was again irrigated thoroughly thoroughly irrigated.  The tourniquet was let down and hemostasis checked with electrocautery. The arthrotomy was repaired with a heavy Quill suture,  followed by 3-0 V lock subcuticular closure, skin staples followed by incisional wound VAC and Polar Care.Marland Kitchen   PLAN OF CARE: Admit for overnight observation   PATIENT DISPOSITION:  PACU - hemodynamically stable.

## 2021-09-16 NOTE — Plan of Care (Signed)

## 2021-09-17 ENCOUNTER — Encounter: Payer: Self-pay | Admitting: Orthopedic Surgery

## 2021-09-17 DIAGNOSIS — M1711 Unilateral primary osteoarthritis, right knee: Secondary | ICD-10-CM | POA: Diagnosis not present

## 2021-09-17 LAB — CBC
HCT: 37.7 % — ABNORMAL LOW (ref 39.0–52.0)
Hemoglobin: 12.8 g/dL — ABNORMAL LOW (ref 13.0–17.0)
MCH: 29.8 pg (ref 26.0–34.0)
MCHC: 34 g/dL (ref 30.0–36.0)
MCV: 87.7 fL (ref 80.0–100.0)
Platelets: 214 K/uL (ref 150–400)
RBC: 4.3 MIL/uL (ref 4.22–5.81)
RDW: 13.8 % (ref 11.5–15.5)
WBC: 6.8 K/uL (ref 4.0–10.5)
nRBC: 0 % (ref 0.0–0.2)

## 2021-09-17 LAB — GLUCOSE, CAPILLARY
Glucose-Capillary: 156 mg/dL — ABNORMAL HIGH (ref 70–99)
Glucose-Capillary: 178 mg/dL — ABNORMAL HIGH (ref 70–99)
Glucose-Capillary: 215 mg/dL — ABNORMAL HIGH (ref 70–99)
Glucose-Capillary: 153 mg/dL — ABNORMAL HIGH (ref 70–99)

## 2021-09-17 LAB — BASIC METABOLIC PANEL
Anion gap: 8 (ref 5–15)
BUN: 19 mg/dL (ref 8–23)
CO2: 27 mmol/L (ref 22–32)
Calcium: 8.8 mg/dL — ABNORMAL LOW (ref 8.9–10.3)
Chloride: 102 mmol/L (ref 98–111)
Creatinine, Ser: 1.36 mg/dL — ABNORMAL HIGH (ref 0.61–1.24)
GFR, Estimated: 56 mL/min — ABNORMAL LOW (ref >60)
Glucose, Bld: 83 mg/dL (ref 70–99)
Potassium: 3.4 mmol/L — ABNORMAL LOW (ref 3.5–5.1)
Sodium: 137 mmol/L (ref 135–145)

## 2021-09-17 MED ORDER — SENNOSIDES-DOCUSATE SODIUM 8.6-50 MG PO TABS: 1.0000 | ORAL_TABLET | Freq: Every evening | ORAL | Status: AC | PRN

## 2021-09-17 MED ORDER — BISACODYL 5 MG PO TBEC: 5.0000 mg | DELAYED_RELEASE_TABLET | Freq: Every day | ORAL | 0 refills | Status: AC | PRN

## 2021-09-17 MED ORDER — HYDROCODONE-ACETAMINOPHEN 5-325 MG PO TABS: 1.0000 | ORAL_TABLET | ORAL | 0 refills | Status: AC | PRN

## 2021-09-17 MED ORDER — MENTHOL 3 MG MT LOZG: 1.0000 | LOZENGE | OROMUCOSAL | Status: DC | PRN

## 2021-09-17 MED ORDER — TRAMADOL HCL 50 MG PO TABS: 50.0000 mg | ORAL_TABLET | Freq: Four times a day (QID) | ORAL | 0 refills | Status: AC

## 2021-09-17 NOTE — TOC Initial Note (Signed)
Transition of Care [facility]) - Initial/Assessment Note    Patient Details  Name: Richard Stark MRN: [id number] Date of Birth: 04-16-1950  Transition of Care [facility]) CM/SW Contact:    Truddie Hidden, RN Phone Number: 09/17/2021, 1:30 PM  Clinical Narrative:                 Spoke with patient at bedside regarding his discharge plan. Patient stated he had been to [facility] before and would prefer to go back.         Patient Goals and CMS Choice        Expected Discharge Plan and Services                                                Prior Living Arrangements/Services                       Activities of Daily Living Home Assistive Devices/Equipment: Dentures (specify type), Eyeglasses ADL Screening (condition at time of admission) Patient's cognitive ability adequate to safely complete daily activities?: Yes Is the patient deaf or have difficulty hearing?: No Does the patient have difficulty seeing, even when wearing glasses/contacts?: Yes Does the patient have difficulty concentrating, remembering, or making decisions?: No Patient able to express need for assistance with ADLs?: Yes Does the patient have difficulty dressing or bathing?: No Independently performs ADLs?: Yes (appropriate for developmental age) Does the patient have difficulty walking or climbing stairs?: Yes Weakness of Legs: Right Weakness of Arms/Hands: Right  Permission Sought/Granted                  Emotional Assessment              Admission diagnosis:  S/P TKR (total knee replacement) using cement, right [Z96.651] Patient Active Problem List   Diagnosis Date Noted   S/P TKR (total knee replacement) using cement, right 09/16/2021   S/P TKR (total knee replacement) using cement, left 04/28/2020   PCP:  Healthcare, [facility] Pharmacy:   [id number] - [place], [place] - [address] AT [facility] [address] [place] [address] Phone:  [phone] Fax: [phone]     Social Determinants of Health (SDOH) Interventions    Readmission Risk Interventions     No data to display

## 2021-09-17 NOTE — NC FL2 (Signed)
[place] MEDICAID FL2 LEVEL OF CARE SCREENING TOOL     IDENTIFICATION  Patient Name: Richard Stark Birthdate: [birth year] Sex: male Admission Date (Current Location): 09/16/2021  [facility] [place] Number:      Facility and Address:  [facility], [address] [place], [place] [address]      Provider Number: [id number]  Attending Physician Name and Address:  Kennedy Bucker, MD  Relative Name and Phone Number:  Lamont Stephenson,[phone]    Current Level of Care: Hospital Recommended Level of Care: Skilled Nursing Facility Prior Approval Number:    Date Approved/Denied:   PASRR Number: [id number]  Discharge Plan: Home    Current Diagnoses: Patient Active Problem List   Diagnosis Date Noted   S/P TKR (total knee replacement) using cement, right 09/16/2021   S/P TKR (total knee replacement) using cement, left 04/28/2020    Orientation RESPIRATION BLADDER Height & Weight     Self, Time, Situation, Place  Normal Continent Weight: 91.4 kg Height:  5\' 6"  (167.6 cm)  BEHAVIORAL SYMPTOMS/MOOD NEUROLOGICAL BOWEL NUTRITION STATUS   (n/a)  (n/a) Continent Diet (Carb modified)  AMBULATORY STATUS COMMUNICATION OF NEEDS Skin   Limited Assist Verbally Other (Comment), Wound Vac (ecchymosis R leg)                       Personal Care Assistance Level of Assistance  Bathing, Dressing Bathing Assistance: Limited assistance   Dressing Assistance: Limited assistance     Functional Limitations Info             SPECIAL CARE FACTORS FREQUENCY  PT (By licensed PT)     PT Frequency: Min 2x weekly              Contractures Contractures Info: Not present    Additional Factors Info  Code Status, Allergies Code Status Info: FULL Allergies Info: Lisinopril           Current Medications (09/17/2021):  This is the current hospital active medication list Current Facility-Administered Medications  Medication Dose Route  Frequency Provider Last Rate Last Admin   0.9 %  sodium chloride infusion   Intravenous Continuous 11/17/2021, MD 100 mL/hr at 09/17/21 0647 Infusion Verify at 09/17/21 0647   acetaminophen (TYLENOL) tablet 325-650 mg  325-650 mg Oral Q6H PRN 11/17/21, MD   650 mg at 09/17/21 0828   alum & mag hydroxide-simeth (MAALOX/MYLANTA) 200-200-20 MG/5ML suspension 30 mL  30 mL Oral Q4H PRN 08-16-2000, MD       amLODipine (NORVASC) tablet 10 mg  10 mg Oral Daily Kennedy Bucker, MD   10 mg at 09/17/21 1017   aspirin chewable tablet 81 mg  81 mg Oral BID 11/17/21, MD   81 mg at 09/17/21 1017   atorvastatin (LIPITOR) tablet 40 mg  40 mg Oral Daily 11/17/21, MD   40 mg at 09/17/21 1017   bisacodyl (DULCOLAX) EC tablet 5 mg  5 mg Oral Daily PRN 11/17/21, MD       carvedilol (COREG) tablet 25 mg  25 mg Oral BID WC Kennedy Bucker, MD   25 mg at 09/17/21 11/17/21   chlorthalidone (HYGROTON) tablet 25 mg  25 mg Oral Daily 1027, MD   25 mg at 09/17/21 1021   clopidogrel (PLAVIX) tablet 75 mg  75 mg Oral Daily 11/17/21, MD   75 mg at 09/17/21 1017   docusate sodium (COLACE) capsule 100 mg  100 mg  Oral BID Kennedy Bucker, MD   100 mg at 09/17/21 1017   gabapentin (NEURONTIN) capsule 300 mg  300 mg Oral BID Kennedy Bucker, MD   300 mg at 09/17/21 1017   hydrALAZINE (APRESOLINE) tablet 25 mg  25 mg Oral TID Kennedy Bucker, MD   25 mg at 09/17/21 1017   HYDROcodone-acetaminophen (NORCO) 7.5-325 MG per tablet 1-2 tablet  1-2 tablet Oral Q4H PRN Kennedy Bucker, MD   2 tablet at 09/17/21 1500   HYDROcodone-acetaminophen (NORCO/VICODIN) 5-325 MG per tablet 1-2 tablet  1-2 tablet Oral Q4H PRN Kennedy Bucker, MD       hydrOXYzine (ATARAX) tablet 10 mg  10 mg Oral TID PRN Kennedy Bucker, MD       insulin aspart (novoLOG) injection 0-15 Units  0-15 Units Subcutaneous TID WC Kennedy Bucker, MD   3 Units at 09/17/21 1018   insulin glargine-yfgn (SEMGLEE) injection 25 Units  25 Units Subcutaneous QHS Kennedy Bucker, MD   25 Units at 09/16/21 2107   magnesium hydroxide (MILK OF MAGNESIA) suspension 30 mL  30 mL Oral QHS Kennedy Bucker, MD   30 mL at 09/16/21 2107   menthol-cetylpyridinium (CEPACOL) lozenge 3 mg  1 lozenge Oral PRN Kennedy Bucker, MD       Or   phenol (CHLORASEPTIC) mouth spray 1 spray  1 spray Mouth/Throat PRN Kennedy Bucker, MD       metFORMIN (GLUCOPHAGE) tablet 1,000 mg  1,000 mg Oral BID WC Kennedy Bucker, MD   1,000 mg at 09/17/21 7829   methocarbamol (ROBAXIN) tablet 500 mg  500 mg Oral Q6H PRN Kennedy Bucker, MD   500 mg at 09/17/21 1501   metoCLOPramide (REGLAN) tablet 5-10 mg  5-10 mg Oral Q8H PRN Kennedy Bucker, MD       Or   metoCLOPramide (REGLAN) injection 5-10 mg  5-10 mg Intravenous Q8H PRN Kennedy Bucker, MD       morphine (PF) 2 MG/ML injection 0.5-1 mg  0.5-1 mg Intravenous Q2H PRN Kennedy Bucker, MD       ondansetron [facility]) tablet 4 mg  4 mg Oral Q6H PRN Kennedy Bucker, MD       Or   ondansetron [facility]) injection 4 mg  4 mg Intravenous Q6H PRN Kennedy Bucker, MD       pantoprazole (PROTONIX) EC tablet 40 mg  40 mg Oral Daily Kennedy Bucker, MD   40 mg at 09/17/21 1017   senna-docusate (Senokot-S) tablet 1 tablet  1 tablet Oral QHS PRN Kennedy Bucker, MD       sodium phosphate (FLEET) 7-19 GM/118ML enema 1 enema  1 enema Rectal Once PRN Kennedy Bucker, MD       traMADol Janean Sark) tablet 50 mg  50 mg Oral Q6H Kennedy Bucker, MD   50 mg at 09/17/21 1157   traZODone (DESYREL) tablet 50 mg  50 mg Oral QHS Kennedy Bucker, MD   50 mg at 09/16/21 2106   zolpidem (AMBIEN) tablet 5 mg  5 mg Oral QHS PRN Kennedy Bucker, MD         Discharge Medications: Please see discharge summary for a list of discharge medications.  Relevant Imaging Results:  Relevant Lab Results:   Additional Information SS# [id number]  Truddie Hidden, RN

## 2021-09-17 NOTE — Plan of Care (Signed)

## 2021-09-17 NOTE — Progress Notes (Signed)
Physical Therapy Treatment Patient Details Name: Richard Stark MRN: [id number] DOB: August 11, 1950 Today's Date: 09/17/2021   History of Present Illness 71 y/o male s/p R TKA 8/10, h/o L TKA March 2022.    PT Comments    Pt was sitting in recliner upon arriving. He agrees to session and was premedicated prior to session. Overall he is progressing but does plan to DC to SNF to maximize independence prior to Dcing home. He continues to require assistance to stand and ambulate. Was slightly limited by pain but did put forth great effort. Author will return this afternoon to progress gait, strength, and ROM.    Recommendations for follow up therapy are one component of a multi-disciplinary discharge planning process, led by the attending physician.  Recommendations may be updated based on patient status, additional functional criteria and insurance authorization.  Follow Up Recommendations  Skilled nursing-short term rehab (<3 hours/day)     Assistance Recommended at Discharge Intermittent Supervision/Assistance  Patient can return home with the following A little help with walking and/or transfers;A little help with bathing/dressing/bathroom;Assistance with cooking/housework;Help with stairs or ramp for entrance;Assist for transportation   Equipment Recommendations  None recommended by PT       Precautions / Restrictions Precautions Precautions: Fall;Knee Precaution Booklet Issued: Yes (comment) Precaution Comments: HEP Restrictions Weight Bearing Restrictions: Yes RLE Weight Bearing: Weight bearing as tolerated     Mobility  Bed Mobility  General bed mobility comments: pt was in relciner pre/post session    Transfers Overall transfer level: Needs assistance Equipment used: Rolling walker (2 wheels) Transfers: Sit to/from Stand Sit to Stand: Min assist     Ambulation/Gait Ambulation/Gait assistance: Min guard, Min assist Gait Distance (Feet): 75 Feet Assistive device:  Rolling walker (2 wheels) Gait Pattern/deviations: Step-through pattern, Antalgic, Trunk flexed Gait velocity: decreased     General Gait Details: Pt was able to tolerate ambulation ~ 50 ft with RW but does have some unsteadiness    Balance Overall balance assessment: Needs assistance Sitting-balance support: Bilateral upper extremity supported Sitting balance-Leahy Scale: Good     Standing balance support: Bilateral upper extremity supported Standing balance-Leahy Scale: Fair Standing balance comment: highly reliant on the walker         Cognition Arousal/Alertness: Awake/alert Behavior During Therapy: Restless, WFL for tasks assessed/performed Overall Cognitive Status: Within Functional Limits for tasks assessed         General Comments General comments (skin integrity, edema, etc.): reviewed bone foam, polar care, need to perform stretching an ther ex. will return this afternoon to progress pt towards PLOF      Pertinent Vitals/Pain Pain Assessment Pain Assessment: No/denies pain     PT Goals (current goals can now be found in the care plan section) Acute Rehab PT Goals Patient Stated Goal: go to rehab then home Progress towards PT goals: Progressing toward goals    Frequency    BID      PT Plan Current plan remains appropriate       AM-PAC PT "6 Clicks" Mobility   Outcome Measure  Help needed turning from your back to your side while in a flat bed without using bedrails?: A Little Help needed moving from lying on your back to sitting on the side of a flat bed without using bedrails?: A Little Help needed moving to and from a bed to a chair (including a wheelchair)?: A Little Help needed standing up from a chair using your arms (e.g., wheelchair or bedside chair)?: A Little  Help needed to walk in hospital room?: A Lot Help needed climbing 3-5 steps with a railing? : A Lot 6 Click Score: 16    End of Session   Activity Tolerance: Patient tolerated  treatment well Patient left: with chair alarm set;with call bell/phone within reach Nurse Communication: Mobility status PT Visit Diagnosis: Muscle weakness (generalized) (M62.81);Ataxic gait (R26.0);Pain Pain - Right/Left: Left Pain - part of body: Knee     Time: 0833-0900 PT Time Calculation (min) (ACUTE ONLY): 27 min  Charges:  $Gait Training: 8-22 mins $Therapeutic Activity: 8-22 mins                    Jetta Lout PTA 09/17/21, 12:06 PM

## 2021-09-17 NOTE — Discharge Instructions (Signed)

## 2021-09-17 NOTE — Progress Notes (Signed)
   Subjective: 1 Day Post-Op Procedure(s) (LRB): TOTAL KNEE ARTHROPLASTY (Right) Patient reports pain as mild.   Patient is well, and has had no acute complaints or problems Denies any CP, SOB, ABD pain. We will continue therapy today.  Plan is to go Skilled nursing facility after hospital stay.  Objective: Vital signs in last 24 hours: Temp:  [96.6 F (35.9 C)-98.4 F (36.9 C)] 98.4 F (36.9 C) (08/11 0747) Pulse Rate:  [59-79] 65 (08/11 0747) Resp:  [11-19] 16 (08/11 0747) BP: (115-178)/(64-96) 125/84 (08/11 0747) SpO2:  [88 %-96 %] 95 % (08/11 0747) Weight:  [91.4 kg] 91.4 kg (08/10 1044)  Intake/Output from previous day: 08/10 0701 - 08/11 0700 In: 2010 [P.O.:380; I.V.:1530; IV Piggyback:100] Out: 410 [Urine:400; Blood:10] Intake/Output this shift: No intake/output data recorded.  Recent Labs    09/17/21 0420  HGB 12.8*   Recent Labs    09/17/21 0420  WBC 6.8  RBC 4.30  HCT 37.7*  PLT 214   Recent Labs    09/17/21 0420  NA 137  K 3.4*  CL 102  CO2 27  BUN 19  CREATININE 1.36*  GLUCOSE 83  CALCIUM 8.8*   No results for input(s): "LABPT", "INR" in the last 72 hours.  EXAM General - Patient is Alert, Appropriate, and Oriented Extremity - Neurovascular intact Sensation intact distally Intact pulses distally Dressing - dressing C/D/I and no drainage, provena intact with out drainage Motor Function - intact, moving foot and toes well on exam.   Past Medical History:  Diagnosis Date   Arthritis    Diabetes mellitus without complication (HCC)    GERD (gastroesophageal reflux disease)    OCC NO MEDS   Hypertension    Stroke (HCC) 2008   RIGHT ARM WEAKNESS    Assessment/Plan:   1 Day Post-Op Procedure(s) (LRB): TOTAL KNEE ARTHROPLASTY (Right) Principal Problem:   S/P TKR (total knee replacement) using cement, right  Estimated body mass index is 32.52 kg/m as calculated from the following:   Height as of this encounter: 5\' 6"  (1.676 m).    Weight as of this encounter: 91.4 kg. Advance diet Up with therapy Labs and VSS Pain controlled CM to assist with discharge, anticipate need for SNF  DVT Prophylaxis - Lovenox, TED hose, and SCDs Weight-Bearing as tolerated to Right leg   T. , PA-C [facility] 09/17/2021, 8:04 AM

## 2021-09-17 NOTE — Anesthesia Postprocedure Evaluation (Signed)
Anesthesia Post Note  Patient: Jahi H Grabinski  Procedure(s) Performed: TOTAL KNEE ARTHROPLASTY (Right: Knee)  Patient location during evaluation: PACU Anesthesia Type: General Level of consciousness: awake and alert Pain management: pain level controlled Vital Signs Assessment: post-procedure vital signs reviewed and stable Respiratory status: spontaneous breathing, nonlabored ventilation, respiratory function stable and patient connected to nasal cannula oxygen Cardiovascular status: blood pressure returned to baseline and stable Postop Assessment: no apparent nausea or vomiting Anesthetic complications: no   No notable events documented.   Last Vitals:  Vitals:   09/16/21 2341 09/17/21 0414  BP: (!) 178/73 (!) 151/96  Pulse: 64 (!) 59  Resp: 17 15  Temp: 36.8 C 36.7 C  SpO2: 95% 93%    Last Pain:  Vitals:   09/16/21 2151  TempSrc:   PainSc: Asleep                 Stephanie Coup

## 2021-09-17 NOTE — Discharge Summary (Incomplete)
Physician Discharge Summary  Patient ID: Richard Stark MRN: [id number] DOB/AGE: 72-Jan-1952 72 y.o.  Admit date: 09/16/2021 Discharge date: 09/21/2021  Admission Diagnoses:  S/P TKR (total knee replacement) using cement, right [Z96.651]   Discharge Diagnoses: Patient Active Problem List   Diagnosis Date Noted   S/P TKR (total knee replacement) using cement, right 09/16/2021   S/P TKR (total knee replacement) using cement, left 04/28/2020    Past Medical History:  Diagnosis Date   Arthritis    Diabetes mellitus without complication (HCC)    GERD (gastroesophageal reflux disease)    OCC NO MEDS   Hypertension    Stroke (HCC) 2008   RIGHT ARM WEAKNESS     Transfusion: None   Consultants (if any):   Discharged Condition: Improved  Hospital Course: Richard Stark is an 71 y.o. male who was admitted 09/16/2021 with a diagnosis of S/P TKR (total knee replacement) using cement, right and went to the operating room on 09/16/2021 and underwent the above named procedures.    Surgeries: Procedure(s): TOTAL KNEE ARTHROPLASTY on 09/16/2021 Patient tolerated the surgery well. Taken to PACU where she was stabilized and then transferred to the orthopedic floor.  Started Plavix, SCDs heels elevated on bed with rolled towels. No evidence of DVT. Negative Homan. Physical therapy started on day #1 for gait training and transfer. OT started day #1 for ADL and assisted devices.  Patient's foley was d/c on day #1. Patient's IV  was d/c on day #2.  Patient made slow gradual progress of physical therapy on postop day 3 through 5.  Vital signs are stable, pain well controlled.  On post op day #5 patient was stable and ready for discharge to home with HHPT.    He was given perioperative antibiotics:  Anti-infectives (From admission, onward)    Start     Dose/Rate Route Frequency Ordered Stop   09/16/21 1930  ceFAZolin (ANCEF) IVPB 2g/100 mL premix        2 g 200 mL/hr over 30 Minutes  Intravenous Every 6 hours 09/16/21 1642 09/17/21 0140   09/16/21 1050  ceFAZolin (ANCEF) 2-4 GM/100ML-% IVPB       Note to Pharmacy: Jonetta Speak E: cabinet override      09/16/21 1050 09/17/21 1458   09/16/21 0600  ceFAZolin (ANCEF) IVPB 2g/100 mL premix        2 g 200 mL/hr over 30 Minutes Intravenous On call to O.R. 09/16/21 0029 09/16/21 1324     .  He was given sequential compression devices, early ambulation, and Plavix, teds for DVT prophylaxis.  He benefited maximally from the hospital stay and there were no complications.    Recent vital signs:  Vitals:   09/21/21 0504 09/21/21 0912  BP: 110/61 (!) 152/72  Pulse: 66 77  Resp: 20 16  Temp: 98 F (36.7 C) 98 F (36.7 C)  SpO2: 99% 99%    Recent laboratory studies:  Lab Results  Component Value Date   HGB 11.5 (L) 09/18/2021   HGB 12.8 (L) 09/17/2021   HGB 15.5 09/06/2021   Lab Results  Component Value Date   WBC 6.4 09/18/2021   PLT 167 09/18/2021   Lab Results  Component Value Date   INR 1.0 06/08/2012   Lab Results  Component Value Date   NA 137 09/17/2021   K 3.4 (L) 09/17/2021   CL 102 09/17/2021   CO2 27 09/17/2021   BUN 19 09/17/2021   CREATININE 1.36 (H) 09/17/2021  GLUCOSE 83 09/17/2021    Discharge Medications:   Allergies as of 09/21/2021       Reactions   Lisinopril Anaphylaxis        Medication List     STOP taking these medications    acetaminophen 500 MG tablet Commonly known as: TYLENOL       TAKE these medications    amLODipine 10 MG tablet Commonly known as: NORVASC Take 10 mg by mouth every morning.   aspirin 81 MG chewable tablet Chew 1 tablet (81 mg total) by mouth 2 (two) times daily.   atorvastatin 40 MG tablet Commonly known as: LIPITOR Take 40 mg by mouth daily.   bisacodyl 5 MG EC tablet Commonly known as: DULCOLAX Take 1 tablet (5 mg total) by mouth daily as needed for moderate constipation.   carvedilol 25 MG tablet Commonly known as:  COREG Take 25 mg by mouth 2 (two) times daily with a meal.   chlorthalidone 25 MG tablet Commonly known as: HYGROTON Take 25 mg by mouth daily.   clopidogrel 75 MG tablet Commonly known as: PLAVIX Take 75 mg by mouth daily.   docusate sodium 100 MG capsule Commonly known as: COLACE Take 1 capsule (100 mg total) by mouth 2 (two) times daily. What changed:  when to take this reasons to take this   gabapentin 300 MG capsule Commonly known as: NEURONTIN Take 300 mg by mouth 2 (two) times daily.   hydrALAZINE 25 MG tablet Commonly known as: APRESOLINE Take 25 mg by mouth 3 (three) times daily.   HYDROcodone-acetaminophen 5-325 MG tablet Commonly known as: NORCO/VICODIN Take 1-2 tablets by mouth every 4 (four) hours as needed for moderate pain (pain score 4-6). What changed: reasons to take this   hydrOXYzine 10 MG tablet Commonly known as: ATARAX Take 10 mg by mouth 3 (three) times daily as needed for itching.   Lantus SoloStar 100 UNIT/ML Solostar Pen Generic drug: insulin glargine Inject 35 Units into the skin at bedtime.   metFORMIN 1000 MG tablet Commonly known as: GLUCOPHAGE Take 1,000 mg by mouth in the morning and at bedtime.   methocarbamol 500 MG tablet Commonly known as: ROBAXIN Take 1 tablet (500 mg total) by mouth every 6 (six) hours as needed for muscle spasms.   senna-docusate 8.6-50 MG tablet Commonly known as: Senokot-S Take 1 tablet by mouth at bedtime as needed for mild constipation.   traMADol 50 MG tablet Commonly known as: ULTRAM Take 1 tablet (50 mg total) by mouth every 6 (six) hours. What changed:  how much to take when to take this reasons to take this   traZODone 50 MG tablet Commonly known as: DESYREL Take 50 mg by mouth at bedtime.   Victoza 18 MG/3ML Sopn Generic drug: liraglutide Inject 1.8 mg into the skin every morning.               Durable Medical Equipment  (From admission, onward)           Start      Ordered   09/16/21 1643  DME Walker rolling  Once       Question Answer Comment  Walker: With 5 Inch Wheels   Patient needs a walker to treat with the following condition S/P TKR (total knee replacement) using cement, right      09/16/21 1642   09/16/21 1643  DME 3 n 1  Once        09/16/21 1642   09/16/21 1643  DME  Bedside commode  Once       Question:  Patient needs a bedside commode to treat with the following condition  Answer:  S/P TKR (total knee replacement) using cement, right   09/16/21 1642            Diagnostic Studies: DG Knee 1-2 Views Right  Result Date: 09/16/2021 CLINICAL DATA:  Status post right knee arthroplasty EXAM: RIGHT KNEE - 1-2 VIEW COMPARISON:  01/24/2014 FINDINGS: There is interval right knee arthroplasty. There are pockets of air in the anterior aspect of the knee along with skin staples. IMPRESSION: Status post right knee arthroplasty. Electronically Signed   By: Elmer Picker M.D.   On: 09/16/2021 15:11    Disposition: Discharge disposition: 06-Home-Health Care Svc          Contact information for follow-up providers     Duanne Guess, PA-C Follow up in 2 week(s).   Specialties: Orthopedic Surgery, Emergency Medicine Contact information: [place] [address] [phone]              Contact information for after-discharge care     Destination     [facility] Preferred SNF .   Service: Skilled Nursing Contact information: [address] [place] [phone]                      Signed: Dorise Hiss [facility] 09/21/2021, 11:11 AM

## 2021-09-18 DIAGNOSIS — M1711 Unilateral primary osteoarthritis, right knee: Secondary | ICD-10-CM | POA: Diagnosis not present

## 2021-09-18 LAB — CBC
HCT: 33.6 % — ABNORMAL LOW (ref 39.0–52.0)
Hemoglobin: 11.5 g/dL — ABNORMAL LOW (ref 13.0–17.0)
MCH: 29.5 pg (ref 26.0–34.0)
MCHC: 34.2 g/dL (ref 30.0–36.0)
MCV: 86.2 fL (ref 80.0–100.0)
Platelets: 167 10*3/uL (ref 150–400)
RBC: 3.9 MIL/uL — ABNORMAL LOW (ref 4.22–5.81)
RDW: 13.4 % (ref 11.5–15.5)
WBC: 6.4 10*3/uL (ref 4.0–10.5)
nRBC: 0 % (ref 0.0–0.2)

## 2021-09-18 LAB — GLUCOSE, CAPILLARY
Glucose-Capillary: 125 mg/dL — ABNORMAL HIGH (ref 70–99)
Glucose-Capillary: 121 mg/dL — ABNORMAL HIGH (ref 70–99)
Glucose-Capillary: 148 mg/dL — ABNORMAL HIGH (ref 70–99)
Glucose-Capillary: 188 mg/dL — ABNORMAL HIGH (ref 70–99)

## 2021-09-18 MED ORDER — ALBUTEROL SULFATE HFA 108 (90 BASE) MCG/ACT IN AERS
2.0000 | INHALATION_SPRAY | Freq: Four times a day (QID) | RESPIRATORY_TRACT | Status: DC
  Administered 2021-09-18 – 2021-09-21 (×11): 2 via RESPIRATORY_TRACT
  Filled 2021-09-18: qty 6.7

## 2021-09-18 NOTE — Progress Notes (Signed)
  Subjective: 2 Days Post-Op Procedure(s) (LRB): TOTAL KNEE ARTHROPLASTY (Right) Patient reports pain as moderate.   Patient is well, and has had no acute complaints or problems. Did well with PT this AM.  Plan is to go Skilled nursing facility after hospital stay. Negative for chest pain and shortness of breath Fever: no Gastrointestinal: negative for nausea and vomiting.  Patient has had a bowel movement.  Objective: Vital signs in last 24 hours: Temp:  [97.5 F (36.4 C)-98.3 F (36.8 C)] 98.3 F (36.8 C) (08/12 0926) Pulse Rate:  [68-87] 81 (08/12 0926) Resp:  [16-20] 16 (08/12 0926) BP: (131-152)/(64-83) 145/83 (08/12 0926) SpO2:  [94 %-100 %] 100 % (08/12 0926)  Intake/Output from previous day:  Intake/Output Summary (Last 24 hours) at 09/18/2021 1054 Last data filed at 09/18/2021 1040 Gross per 24 hour  Intake 220.2 ml  Output 1450 ml  Net -1229.8 ml    Intake/Output this shift: Total I/O In: 120 [P.O.:120] Out: 300 [Urine:300]  Labs: Recent Labs    09/17/21 0420 09/18/21 0430  HGB 12.8* 11.5*   Recent Labs    09/17/21 0420 09/18/21 0430  WBC 6.8 6.4  RBC 4.30 3.90*  HCT 37.7* 33.6*  PLT 214 167   Recent Labs    09/17/21 0420  NA 137  K 3.4*  CL 102  CO2 27  BUN 19  CREATININE 1.36*  GLUCOSE 83  CALCIUM 8.8*   No results for input(s): "LABPT", "INR" in the last 72 hours.   EXAM General - Patient is Alert, Appropriate, and Oriented Extremity - Neurovascular intact Dorsiflexion/Plantar flexion intact Compartment soft Dressing/Incision -Prevena in place, ~50 mL drainage Motor Function - intact, moving foot and toes well on exam.     Assessment/Plan: 2 Days Post-Op Procedure(s) (LRB): TOTAL KNEE ARTHROPLASTY (Right) Principal Problem:   S/P TKR (total knee replacement) using cement, right  Estimated body mass index is 32.52 kg/m as calculated from the following:   Height as of this encounter: 5\' 6"  (1.676 m).   Weight as of this  encounter: 91.4 kg. Advance diet Up with therapy  Awaiting insurance auth for SNF placement-plan to go to [facility]   Labs reviewed.   DVT Prophylaxis - Lovenox, Ted hose, and SCDs Weight-Bearing as tolerated to right leg  , PA-C [facility] Orthopaedic Surgery 09/18/2021, 10:54 AM

## 2021-09-18 NOTE — Progress Notes (Signed)
Physical Therapy Treatment Patient Details Name: Richard Stark MRN: [id number] DOB: November 11, 1950 Today's Date: 09/18/2021   History of Present Illness 71 y/o male s/p R TKA 8/10, h/o L TKA March 2022.    PT Comments    Pt was sitting in recliner upon arrival. He is agreeable to session and requesting to return to bed after ambulation. Pt tolerated session well but does still require assistance with all mobility, transfers, and gait.Tolerated there ex and ROM well. Pt had recent TKA and went to rehab post acute admission. He would like to rehab prior to returning home. Prefers rehab at [facility]. Recommend SNF at [place]. Acute PT will continue to follow and progress as able per current POC.   Recommendations for follow up therapy are one component of a multi-disciplinary discharge planning process, led by the attending physician.  Recommendations may be updated based on patient status, additional functional criteria and insurance authorization.  Follow Up Recommendations  Skilled nursing-short term rehab (<3 hours/day) (pt went to [facility] rehab after last TKA and is hopeful this admissionas well)     Assistance Recommended at Discharge Intermittent Supervision/Assistance  Patient can return home with the following A little help with walking and/or transfers;A little help with bathing/dressing/bathroom;Assistance with cooking/housework;Help with stairs or ramp for entrance;Assist for transportation   Equipment Recommendations  None recommended by PT       Precautions / Restrictions Precautions Precautions: Fall;Knee Precaution Booklet Issued: Yes (comment) Precaution Comments: HEP Restrictions Weight Bearing Restrictions: Yes RLE Weight Bearing: Weight bearing as tolerated     Mobility  Bed Mobility Overal bed mobility: Needs Assistance Bed Mobility: Sit to Supine       Sit to supine: Min assist, Mod assist        Transfers Overall transfer level: Needs  assistance Equipment used: Rolling walker (2 wheels) Transfers: Sit to/from Stand Sit to Stand: Min assist                Ambulation/Gait Ambulation/Gait assistance: Min guard, Min assist   Assistive device: Rolling walker (2 wheels) Gait Pattern/deviations: Step-through pattern, Antalgic, Trunk flexed Gait velocity: decreased     General Gait Details: pt is mostly CGA however occasional min assist to navigate RW /turn safely. vcs for posture correction and improved step quality      Balance Overall balance assessment: Needs assistance Sitting-balance support: Bilateral upper extremity supported Sitting balance-Leahy Scale: Good     Standing balance support: Bilateral upper extremity supported Standing balance-Leahy Scale: Fair Standing balance comment: highly reliant on the walker      Cognition Arousal/Alertness: Awake/alert Behavior During Therapy: WFL for tasks assessed/performed Overall Cognitive Status: Within Functional Limits for tasks assessed         Exercises Total Joint Exercises Ankle Circles/Pumps: AROM, 10 reps Quad Sets: Strengthening, 10 reps Heel Slides: AROM, 10 reps Hip ABduction/ADduction: AAROM, 5 reps Straight Leg Raises: AAROM, 10 reps      PT Goals (current goals can now be found in the care plan section) Acute Rehab PT Goals Patient Stated Goal: go to rehab then home    Frequency    BID      PT Plan Current plan remains appropriate       AM-PAC PT "6 Clicks" Mobility   Outcome Measure  Help needed turning from your back to your side while in a flat bed without using bedrails?: A Little Help needed moving from lying on your back to sitting on the side of a flat bed  without using bedrails?: A Little Help needed moving to and from a bed to a chair (including a wheelchair)?: A Little Help needed standing up from a chair using your arms (e.g., wheelchair or bedside chair)?: A Little Help needed to walk in hospital room?: A  Little Help needed climbing 3-5 steps with a railing? : A Lot 6 Click Score: 17    End of Session   Activity Tolerance: Patient tolerated treatment well Patient left: in bed;with call bell/phone within reach;with bed alarm set Nurse Communication: Mobility status PT Visit Diagnosis: Muscle weakness (generalized) (M62.81);Ataxic gait (R26.0);Pain Pain - Right/Left: Left Pain - part of body: Knee     Time:  -     Charges:                       Jetta Lout PTA 09/18/21, 7:54 AM

## 2021-09-18 NOTE — Progress Notes (Signed)
Physical Therapy Treatment Patient Details Name: Richard Stark MRN: [id number] DOB: April 12, 1950 Today's Date: 09/18/2021   History of Present Illness 71 y/o male s/p R TKA 8/10, h/o L TKA March 2022.    PT Comments    Pt was long sitting in bed upon arriving. He is alert however disoriented x 2. Does not recognize author after being seen twice day prior. He blames cognition concerns on pain medicines. RN made aware. Was able to exit bed, stand, and ambulate with assist. Pt went to [facility] in past and is planning to [place] to [facility] rehab when cleared. Pt needed to have BM after ambulation. RN tech in to assist him. Author will return later to progress ROM/strength as able per pt tolerance.   Recommendations for follow up therapy are one component of a multi-disciplinary discharge planning process, led by the attending physician.  Recommendations may be updated based on patient status, additional functional criteria and insurance authorization.  Follow Up Recommendations  Skilled nursing-short term rehab (<3 hours/day)     Assistance Recommended at Discharge Intermittent Supervision/Assistance  Patient can return home with the following A little help with walking and/or transfers;A little help with bathing/dressing/bathroom;Assistance with cooking/housework;Direct supervision/assist for medications management;Direct supervision/assist for financial management;Assist for transportation;Help with stairs or ramp for entrance   Equipment Recommendations  None recommended by PT (pt reports still having equipment from last TKA)       Precautions / Restrictions Precautions Precautions: Fall;Knee Precaution Booklet Issued: Yes (comment) Precaution Comments: HEP Restrictions Weight Bearing Restrictions: Yes RLE Weight Bearing: Weight bearing as tolerated     Mobility  Bed Mobility Overal bed mobility: Needs Assistance Bed Mobility: Supine to Sit     Supine to sit: Min assist, Mod assist Sit  to supine: Min assist, Mod assist   General bed mobility comments: from flat bed surface, required more assistance to exit bed today. min-mod assist + increased time and vcs for improved sequencing.    Transfers Overall transfer level: Needs assistance Equipment used: Rolling walker (2 wheels) Transfers: Sit to/from Stand Sit to Stand: Min assist, From elevated surface           General transfer comment: pt continues to require min assist to achieve full upright standing. once in standing does well.    Ambulation/Gait Ambulation/Gait assistance: Min guard, Supervision Gait Distance (Feet): 160 Feet Assistive device: Rolling walker (2 wheels) Gait Pattern/deviations: Step-through pattern, Antalgic, Trunk flexed Gait velocity: decreased     General Gait Details: pt was able to ambualte around RN station however poor gait kinematics. Antalgic steop through pattern with vcs throughout for improved sequencing. Overall tolerated well     Balance Overall balance assessment: Needs assistance Sitting-balance support: Feet supported Sitting balance-Leahy Scale: Good     Standing balance support: Bilateral upper extremity supported, During functional activity Standing balance-Leahy Scale: Fair Standing balance comment: highly reliant on the walker      Cognition Arousal/Alertness: Awake/alert Behavior During Therapy: WFL for tasks assessed/performed Overall Cognitive Status: No family/caregiver present to determine baseline cognitive functioning      General Comments: pt is A however disoriented to situation and time. He thinks its night time and just fionished eating dinner. Pleasantly confused but able to follow commands consistently with increased time        Exercises Total Joint Exercises Ankle Circles/Pumps: AROM, 10 reps Quad Sets: Strengthening, 10 reps Heel Slides: AROM, 10 reps Hip ABduction/ADduction: AAROM, 5 reps Straight Leg Raises: AAROM, 10 reps  General Comments General comments (skin integrity, edema, etc.): pt was in BR for BM t conclusion of PT session. RN tech in BR with pt at conclusion. Will address ROM/strength deficits in PM session      Pertinent Vitals/Pain Pain Assessment Pain Assessment: 0-10 Pain Score: 7  Pain Location: R knee Pain Descriptors / Indicators: Discomfort Pain Intervention(s): Limited activity within patient's tolerance, Monitored during session, Premedicated before session, Repositioned, Ice applied           PT Goals (current goals can now be found in the care plan section) Acute Rehab PT Goals Patient Stated Goal: go to rehab then home Progress towards PT goals: Progressing toward goals    Frequency    BID      PT Plan Current plan remains appropriate       AM-PAC PT "6 Clicks" Mobility   Outcome Measure  Help needed turning from your back to your side while in a flat bed without using bedrails?: A Little Help needed moving from lying on your back to sitting on the side of a flat bed without using bedrails?: A Lot Help needed moving to and from a bed to a chair (including a wheelchair)?: A Lot Help needed standing up from a chair using your arms (e.g., wheelchair or bedside chair)?: A Little Help needed to walk in hospital room?: A Little Help needed climbing 3-5 steps with a railing? : A Lot 6 Click Score: 15    End of Session   Activity Tolerance: Patient tolerated treatment well Patient left: Other (comment) (in BR for BM with RN tech present) Nurse Communication: Mobility status PT Visit Diagnosis: Muscle weakness (generalized) (M62.81);Ataxic gait (R26.0);Pain Pain - Right/Left: Left Pain - part of body: Knee     Time: 1194-1740 PT Time Calculation (min) (ACUTE ONLY): 18 min  Charges:  $Gait Training: 8-22 mins                     Jetta Lout PTA 09/18/21, 10:18 AM

## 2021-09-18 NOTE — Progress Notes (Signed)
Physical Therapy Treatment Patient Details Name: Richard Stark MRN: [id number] DOB: [birth year] Today's Date: 09/18/2021   History of Present Illness 71 y/o male s/p R TKA 8/10, h/o L TKA March 2022.    PT Comments    Pt was long sitting in bed attempting to perform there ex while foot still in bone foam. He was re-educated on there ex and did tolerate performance of exercises. Pt was able to ambulate 160 ft with RW but still has poor insight of deficits and still is high fall risk due to deficits. Pt overall tolerated PT well today and is progressing towards PLOF. Recommend DC to SNF to address these deficits while maximizing independence with ADLs.    Recommendations for follow up therapy are one component of a multi-disciplinary discharge planning process, led by the attending physician.  Recommendations may be updated based on patient status, additional functional criteria and insurance authorization.  Follow Up Recommendations  Skilled nursing-short term rehab (<3 hours/day)     Assistance Recommended at Discharge Intermittent Supervision/Assistance  Patient can return home with the following A little help with walking and/or transfers;A little help with bathing/dressing/bathroom;Assistance with cooking/housework;Direct supervision/assist for medications management;Direct supervision/assist for financial management;Assist for transportation;Help with stairs or ramp for entrance   Equipment Recommendations  None recommended by PT       Precautions / Restrictions Precautions Precautions: Fall;Knee Precaution Booklet Issued: Yes (comment) Precaution Comments: HEP Restrictions Weight Bearing Restrictions: Yes RLE Weight Bearing: Weight bearing as tolerated     Mobility  Bed Mobility Overal bed mobility: Needs Assistance Bed Mobility: Supine to Sit  Supine to sit: Min assist Sit to supine: Min assist   General bed mobility comments: pt required less assistance this  afternoon than AM session. Less pain?    Transfers Overall transfer level: Needs assistance Equipment used: Rolling walker (2 wheels) Transfers: Sit to/from Stand Sit to Stand: Min guard, Min assist, From elevated surface      General transfer comment: CGA mostly but slight min to assist pt with fwd wt shift. Vcs throughout for reminders ion technique and sequencing. poor carry-over between sessions.    Ambulation/Gait Ambulation/Gait assistance: Supervision, Min guard Gait Distance (Feet): 160 Feet Assistive device: Rolling walker (2 wheels) Gait Pattern/deviations: Step-through pattern, Antalgic, Trunk flexed Gait velocity: decreased     General Gait Details: CGA when pt is turning/talking to other in hallway. pt is easily distracted but pleasant. High fall risk due to safety awareness/insight    Balance Overall balance assessment: Needs assistance Sitting-balance support: Feet supported Sitting balance-Leahy Scale: Good     Standing balance support: Bilateral upper extremity supported, During functional activity Standing balance-Leahy Scale: Fair       Cognition Arousal/Alertness: Awake/alert Behavior During Therapy: WFL for tasks assessed/performed, Impulsive (slightly impulsive at times) Overall Cognitive Status: No family/caregiver present to determine baseline cognitive functioning    General Comments: Pt continues to be pleasant and agreeable. Willing to perform all task desired of him however author questions if pt truely understands current situation. In AM session, endorsing not wanting anything else for pain than tylonol but this afternoon states that he can do better now that he recieved the "strong stuff". Pts cognition is biggest concern going forward        Exercises Total Joint Exercises Ankle Circles/Pumps: AROM, 10 reps Quad Sets: AROM, 10 reps Gluteal Sets: AROM, 10 reps Heel Slides: AROM, 5 reps Hip ABduction/ADduction: AROM, 10 reps Straight Leg  Raises: AAROM, 5 reps Goniometric ROM: ~4-84 (  After performing stretching EOB)        Pertinent Vitals/Pain Pain Assessment Pain Assessment: 0-10 Pain Score: 4  Pain Location: R knee Pain Descriptors / Indicators: Discomfort Pain Intervention(s): Limited activity within patient's tolerance, Monitored during session, Premedicated before session, Repositioned, Ice applied     PT Goals (current goals can now be found in the care plan section) Acute Rehab PT Goals Patient Stated Goal: [facility] for a week then home Progress towards PT goals: Progressing toward goals    Frequency    BID      PT Plan Current plan remains appropriate       AM-PAC PT "6 Clicks" Mobility   Outcome Measure  Help needed turning from your back to your side while in a flat bed without using bedrails?: A Little Help needed moving from lying on your back to sitting on the side of a flat bed without using bedrails?: A Little Help needed moving to and from a bed to a chair (including a wheelchair)?: A Lot Help needed standing up from a chair using your arms (e.g., wheelchair or bedside chair)?: A Little Help needed to walk in hospital room?: A Little Help needed climbing 3-5 steps with a railing? : A Lot 6 Click Score: 16    End of Session   Activity Tolerance: Patient tolerated treatment well Patient left: in bed;with call bell/phone within reach;with bed alarm set;with SCD's reapplied (bone foam + polar care in place) Nurse Communication: Mobility status PT Visit Diagnosis: Muscle weakness (generalized) (M62.81);Ataxic gait (R26.0);Pain Pain - Right/Left: Left Pain - part of body: Knee     Time: 3154-0086 PT Time Calculation (min) (ACUTE ONLY): 20 min  Charges:  $Therapeutic Exercise: 8-22 mins                    Jetta Lout PTA 09/18/21, 4:09 PM

## 2021-09-19 DIAGNOSIS — M1711 Unilateral primary osteoarthritis, right knee: Secondary | ICD-10-CM | POA: Diagnosis not present

## 2021-09-19 LAB — GLUCOSE, CAPILLARY
Glucose-Capillary: 174 mg/dL — ABNORMAL HIGH (ref 70–99)
Glucose-Capillary: 80 mg/dL (ref 70–99)
Glucose-Capillary: 157 mg/dL — ABNORMAL HIGH (ref 70–99)
Glucose-Capillary: 175 mg/dL — ABNORMAL HIGH (ref 70–99)
Glucose-Capillary: 160 mg/dL — ABNORMAL HIGH (ref 70–99)

## 2021-09-19 MED ORDER — IPRATROPIUM-ALBUTEROL 0.5-2.5 (3) MG/3ML IN SOLN: 3.0000 mL | Freq: Once | RESPIRATORY_TRACT | Status: DC

## 2021-09-19 NOTE — Plan of Care (Signed)

## 2021-09-19 NOTE — TOC Progression Note (Signed)
Transition of Care [facility]) - Progression Note    Patient Details  Name: Richard Stark MRN: [id number] Date of Birth: 1950/12/24  [facility] CM/SW Contact  Marlowe Sax, RN Phone Number: 09/19/2021, 8:39 AM  Clinical Narrative:    Reached out to facilities and requested that they review to make a bed offer, Will review bed offers with the patient once obtained, will need Ins approval as well        Expected Discharge Plan and Services                                                 Social Determinants of Health (SDOH) Interventions    Readmission Risk Interventions     No data to display

## 2021-09-19 NOTE — Progress Notes (Signed)
Physical Therapy Treatment Patient Details Name: Richard Stark MRN: [id number] DOB: Mar 23, 1950 Today's Date: 09/19/2021   History of Present Illness 71 y/o male s/p R TKA 8/10, h/o L TKA March 2022.    PT Comments    Participated in exercises as described below.  OOB and completed lap on unit with several self initiated rest breaks.  Remains easily distracted with poor safety awareness.  Remains at increased fall risk.  SNF remains appropriate for transition given cognition, safety and fall risk at this time.   Recommendations for follow up therapy are one component of a multi-disciplinary discharge planning process, led by the attending physician.  Recommendations may be updated based on patient status, additional functional criteria and insurance authorization.  Follow Up Recommendations  Skilled nursing-short term rehab (<3 hours/day)     Assistance Recommended at Discharge Intermittent Supervision/Assistance  Patient can return home with the following A little help with walking and/or transfers;A little help with bathing/dressing/bathroom;Assistance with cooking/housework;Direct supervision/assist for medications management;Direct supervision/assist for financial management;Assist for transportation;Help with stairs or ramp for entrance   Equipment Recommendations  None recommended by PT    Recommendations for Other Services       Precautions / Restrictions Precautions Precautions: Fall;Knee Precaution Booklet Issued: Yes (comment) Precaution Comments: HEP Restrictions Weight Bearing Restrictions: Yes RLE Weight Bearing: Weight bearing as tolerated     Mobility  Bed Mobility Overal bed mobility: Needs Assistance Bed Mobility: Supine to Sit     Supine to sit: Min guard          Transfers Overall transfer level: Needs assistance Equipment used: Rolling walker (2 wheels) Transfers: Sit to/from Stand Sit to Stand: Min guard, Min assist, From elevated  surface           General transfer comment: CGA mostly but slight min to assist pt with fwd wt shift. Vcs throughout for reminders ion technique and sequencing. poor carry-over between sessions.    Ambulation/Gait Ambulation/Gait assistance: Min guard, Min assist Gait Distance (Feet): 160 Feet Assistive device: Rolling walker (2 wheels) Gait Pattern/deviations: Step-through pattern, Antalgic, Trunk flexed Gait velocity: decreased     General Gait Details: CGA when pt is turning/talking to other in hallway. pt is easily distracted but pleasant. High fall risk due to safety awareness/insight   Stairs             Wheelchair Mobility    Modified Rankin (Stroke Patients Only)       Balance Overall balance assessment: Needs assistance Sitting-balance support: Feet supported Sitting balance-Leahy Scale: Good     Standing balance support: Bilateral upper extremity supported, During functional activity Standing balance-Leahy Scale: Fair                              Cognition Arousal/Alertness: Awake/alert Behavior During Therapy: WFL for tasks assessed/performed, Impulsive Overall Cognitive Status: No family/caregiver present to determine baseline cognitive functioning                                          Exercises Total Joint Exercises Ankle Circles/Pumps: AROM, 10 reps Quad Sets: AROM, 10 reps Gluteal Sets: AROM, 10 reps Heel Slides: AROM, 5 reps Hip ABduction/ADduction: AROM, 10 reps Straight Leg Raises: AAROM, 5 reps Goniometric ROM: 4-92 Other Exercises Other Exercises: supine and seated TKR HEP x 10    General  Comments        Pertinent Vitals/Pain Pain Assessment Pain Assessment: Faces Faces Pain Scale: Hurts little more Pain Location: R knee Pain Descriptors / Indicators: Discomfort Pain Intervention(s): Limited activity within patient's tolerance, Monitored during session, Premedicated before session,  Repositioned, Ice applied    Home Living                          Prior Function            PT Goals (current goals can now be found in the care plan section) Progress towards PT goals: Progressing toward goals    Frequency    BID      PT Plan Current plan remains appropriate    Co-evaluation              AM-PAC PT "6 Clicks" Mobility   Outcome Measure  Help needed turning from your back to your side while in a flat bed without using bedrails?: None Help needed moving from lying on your back to sitting on the side of a flat bed without using bedrails?: A Little Help needed moving to and from a bed to a chair (including a wheelchair)?: A Little Help needed standing up from a chair using your arms (e.g., wheelchair or bedside chair)?: A Little Help needed to walk in hospital room?: A Little Help needed climbing 3-5 steps with a railing? : A Lot 6 Click Score: 18    End of Session   Activity Tolerance: Patient tolerated treatment well Patient left: in bed;with call bell/phone within reach;with bed alarm set;with SCD's reapplied (bone foam + polar care in place) Nurse Communication: Mobility status PT Visit Diagnosis: Muscle weakness (generalized) (M62.81);Ataxic gait (R26.0);Pain Pain - Right/Left: Left Pain - part of body: Knee     Time: 0921-0945 PT Time Calculation (min) (ACUTE ONLY): 24 min  Charges:  $Gait Training: 8-22 mins $Therapeutic Exercise: 8-22 mins                   Danielle Dess, PTA 09/19/21, 11:02 AM

## 2021-09-19 NOTE — Progress Notes (Addendum)
  Subjective: 3 Days Post-Op Procedure(s) (LRB): TOTAL KNEE ARTHROPLASTY (Right) Patient reports pain as well-controlled.   Patient states he doesn't feel well today but denies any specific complaints. Plan is to go Skilled nursing facility after hospital stay. Negative for chest pain, reports some shortness of breath Fever: no Gastrointestinal: negative for nausea and vomiting.    Objective: Vital signs in last 24 hours: Temp:  [98.2 F (36.8 C)-98.4 F (36.9 C)] 98.2 F (36.8 C) (08/13 0828) Pulse Rate:  [71-86] 71 (08/13 0828) Resp:  [16-18] 18 (08/13 0447) BP: (116-136)/(63-83) 116/76 (08/13 0828) SpO2:  [96 %-100 %] 96 % (08/13 0828)  Intake/Output from previous day:  Intake/Output Summary (Last 24 hours) at 09/19/2021 1114 Last data filed at 09/19/2021 1004 Gross per 24 hour  Intake 360 ml  Output 2170 ml  Net -1810 ml    Intake/Output this shift: Total I/O In: -  Out: 400 [Urine:400]  Labs: Recent Labs    09/17/21 0420 09/18/21 0430  HGB 12.8* 11.5*   Recent Labs    09/17/21 0420 09/18/21 0430  WBC 6.8 6.4  RBC 4.30 3.90*  HCT 37.7* 33.6*  PLT 214 167   Recent Labs    09/17/21 0420  NA 137  K 3.4*  CL 102  CO2 27  BUN 19  CREATININE 1.36*  GLUCOSE 83  CALCIUM 8.8*   No results for input(s): "LABPT", "INR" in the last 72 hours.   EXAM General - Patient is slightly confused, oriented x3 but does not know year  Extremity - Neurovascular intact Dorsiflexion/Plantar flexion intact Compartment soft Dressing/Incision -Prevena in place, just over 50cc  bloody drainage noted Motor Function - intact, moving foot and toes well on exam. Cardiovascular- Regular rate and rhythm, no murmurs/rubs/gallops Respiratory-  slight wheezing in right lung base, no crackles     Assessment/Plan: 3 Days Post-Op Procedure(s) (LRB): TOTAL KNEE ARTHROPLASTY (Right) Principal Problem:   S/P TKR (total knee replacement) using cement, right  Estimated body mass  index is 32.52 kg/m as calculated from the following:   Height as of this encounter: 5\' 6"  (1.676 m).   Weight as of this encounter: 91.4 kg. Advance diet Up with therapy  Order placed for one time DuoNeb treatment.      DVT Prophylaxis - Ted hose and SCDs, ASA, Plavix Weight-Bearing as tolerated to right leg  , PA-C [facility] 09/19/2021, 11:14 AM

## 2021-09-20 ENCOUNTER — Encounter: Payer: Self-pay | Admitting: Orthopedic Surgery

## 2021-09-20 DIAGNOSIS — M1711 Unilateral primary osteoarthritis, right knee: Secondary | ICD-10-CM | POA: Diagnosis not present

## 2021-09-20 LAB — GLUCOSE, CAPILLARY
Glucose-Capillary: 136 mg/dL — ABNORMAL HIGH (ref 70–99)
Glucose-Capillary: 146 mg/dL — ABNORMAL HIGH (ref 70–99)
Glucose-Capillary: 110 mg/dL — ABNORMAL HIGH (ref 70–99)
Glucose-Capillary: 144 mg/dL — ABNORMAL HIGH (ref 70–99)

## 2021-09-20 NOTE — Progress Notes (Signed)
Physical Therapy Treatment Patient Details Name: Richard Stark MRN: [id number] DOB: 06/04/1950 Today's Date: 09/20/2021   History of Present Illness 71 y/o male s/p R TKA 8/10, h/o L TKA March 2022.    PT Comments    OOB and completed lap and stair training this pm with min guard/assist and mod cues.  Pt continues to make progress in mobility but primary barrier to d/c remains cognition and decreased safety requiring mod cues and hands on assist for mobility.  Continue to recommend +1 assist for skilled cues and education and transition to SNF per MD request.   Recommendations for follow up therapy are one component of a multi-disciplinary discharge planning process, led by the attending physician.  Recommendations may be updated based on patient status, additional functional criteria and insurance authorization.  Follow Up Recommendations  Skilled nursing-short term rehab (<3 hours/day)     Assistance Recommended at Discharge Intermittent Supervision/Assistance  Patient can return home with the following A little help with walking and/or transfers;A little help with bathing/dressing/bathroom;Assistance with cooking/housework;Direct supervision/assist for medications management;Direct supervision/assist for financial management;Assist for transportation;Help with stairs or ramp for entrance   Equipment Recommendations  None recommended by PT    Recommendations for Other Services       Precautions / Restrictions Precautions Precautions: Fall;Knee Precaution Booklet Issued: Yes (comment) Precaution Comments: HEP Restrictions Weight Bearing Restrictions: Yes RLE Weight Bearing: Weight bearing as tolerated     Mobility  Bed Mobility Overal bed mobility: Needs Assistance Bed Mobility: Supine to Sit     Supine to sit: Min guard          Transfers Overall transfer level: Needs assistance Equipment used: Rolling walker (2 wheels) Transfers: Sit to/from Stand Sit to  Stand: Min guard, Min assist, From elevated surface           General transfer comment: CGA mostly but slight min to assist pt with fwd wt shift. Vcs throughout for reminders ion technique and sequencing. poor carry-over between sessions.    Ambulation/Gait Ambulation/Gait assistance: Min guard, Min assist Gait Distance (Feet): 160 Feet Assistive device: Rolling walker (2 wheels) Gait Pattern/deviations: Step-through pattern, Antalgic, Trunk flexed Gait velocity: decreased     General Gait Details: CGA when pt is turning/talking to other in hallway. pt is easily distracted but pleasant. High fall risk due to safety awareness/insight   Stairs Stairs: Yes Stairs assistance: Min assist Stair Management: Two rails Number of Stairs: 3 General stair comments: mod verbal cues for sequencing   Wheelchair Mobility    Modified Rankin (Stroke Patients Only)       Balance Overall balance assessment: Needs assistance Sitting-balance support: Feet supported Sitting balance-Leahy Scale: Good     Standing balance support: Bilateral upper extremity supported, During functional activity Standing balance-Leahy Scale: Fair                              Cognition Arousal/Alertness: Awake/alert Behavior During Therapy: WFL for tasks assessed/performed, Impulsive Overall Cognitive Status: Within Functional Limits for tasks assessed                                 General Comments: seemed better today with less impulsiveness and increased safety        Exercises Total Joint Exercises Ankle Circles/Pumps: AROM, 10 reps Quad Sets: AROM, 10 reps Gluteal Sets: AROM, 10 reps Heel Slides: AROM,  5 reps Hip ABduction/ADduction: AROM, 10 reps Straight Leg Raises: AAROM, 5 reps Goniometric ROM: 3-95    General Comments        Pertinent Vitals/Pain Pain Assessment Pain Assessment: Faces Faces Pain Scale: Hurts little more Pain Location: R knee Pain  Descriptors / Indicators: Discomfort Pain Intervention(s): Limited activity within patient's tolerance, Monitored during session, Repositioned, Ice applied    Home Living                          Prior Function            PT Goals (current goals can now be found in the care plan section) Progress towards PT goals: Progressing toward goals    Frequency    BID      PT Plan Current plan remains appropriate    Co-evaluation              AM-PAC PT "6 Clicks" Mobility   Outcome Measure  Help needed turning from your back to your side while in a flat bed without using bedrails?: None Help needed moving from lying on your back to sitting on the side of a flat bed without using bedrails?: A Little Help needed moving to and from a bed to a chair (including a wheelchair)?: A Little Help needed standing up from a chair using your arms (e.g., wheelchair or bedside chair)?: A Little Help needed to walk in hospital room?: A Little Help needed climbing 3-5 steps with a railing? : A Lot 6 Click Score: 18    End of Session   Activity Tolerance: Patient tolerated treatment well Patient left: in bed;with call bell/phone within reach;with bed alarm set;with SCD's reapplied (bone foam + polar care in place) Nurse Communication: Mobility status PT Visit Diagnosis: Muscle weakness (generalized) (M62.81);Ataxic gait (R26.0);Pain Pain - Right/Left: Left Pain - part of body: Knee     Time: 3235-5732 PT Time Calculation (min) (ACUTE ONLY): 15 min  Charges:  $Gait Training: 8-22 mins                   Danielle Dess, PTA 09/20/21, 2:25 PM

## 2021-09-20 NOTE — Progress Notes (Signed)
   Subjective: 4 Days Post-Op Procedure(s) (LRB): TOTAL KNEE ARTHROPLASTY (Right) Patient reports pain as mild.   Patient is well, and has had no acute complaints or problems Denies any CP, SOB, ABD pain. We will continue therapy today.  Plan is to go Skilled nursing facility after hospital stay.  Objective: Vital signs in last 24 hours: Temp:  [98.1 F (36.7 C)-98.8 F (37.1 C)] 98.2 F (36.8 C) (08/14 0742) Pulse Rate:  [71-87] 73 (08/14 0742) Resp:  [14-18] 16 (08/14 0742) BP: (116-151)/(62-76) 151/62 (08/14 0742) SpO2:  [93 %-100 %] 98 % (08/14 0742)  Intake/Output from previous day: 08/13 0701 - 08/14 0700 In: 240 [P.O.:240] Out: 900 [Urine:800; Drains:100] Intake/Output this shift: Total I/O In: -  Out: 350 [Urine:350]  Recent Labs    09/18/21 0430  HGB 11.5*   Recent Labs    09/18/21 0430  WBC 6.4  RBC 3.90*  HCT 33.6*  PLT 167   No results for input(s): "NA", "K", "CL", "CO2", "BUN", "CREATININE", "GLUCOSE", "CALCIUM" in the last 72 hours.  No results for input(s): "LABPT", "INR" in the last 72 hours.  EXAM General - Patient is Alert, Appropriate, and Oriented Extremity - Neurovascular intact Sensation intact distally Intact pulses distally Dressing - dressing C/D/I and no drainage, provena intact with 100 cc drainage Motor Function - intact, moving foot and toes well on exam.   Past Medical History:  Diagnosis Date   Arthritis    Diabetes mellitus without complication (HCC)    GERD (gastroesophageal reflux disease)    OCC NO MEDS   Hypertension    Stroke (HCC) 2008   RIGHT ARM WEAKNESS    Assessment/Plan:   4 Days Post-Op Procedure(s) (LRB): TOTAL KNEE ARTHROPLASTY (Right) Principal Problem:   S/P TKR (total knee replacement) using cement, right  Estimated body mass index is 32.52 kg/m as calculated from the following:   Height as of this encounter: 5\' 6"  (1.676 m).   Weight as of this encounter: 91.4 kg. Advance diet Up with  therapy Labs and VSS Pain controlled Work on BM CM to assist with discharge to SNF  DVT Prophylaxis - Lovenox, TED hose, and SCDs Weight-Bearing as tolerated to Right leg   T. , PA-C [facility] 09/20/2021, 8:10 AM

## 2021-09-20 NOTE — Plan of Care (Signed)
  Problem: Education: Goal: Ability to describe self-care measures that may prevent or decrease complications (Diabetes Survival Skills Education) will improve Outcome: Progressing   Problem: Nutritional: Goal: Maintenance of adequate nutrition will improve Outcome: Progressing   Problem: Skin Integrity: Goal: Risk for impaired skin integrity will decrease Outcome: Progressing   Problem: Pain Management: Goal: Pain level will decrease with appropriate interventions Outcome: Progressing   Problem: Skin Integrity: Goal: Will show signs of wound healing Outcome: Progressing

## 2021-09-20 NOTE — Progress Notes (Signed)
Physical Therapy Treatment Patient Details Name: Richard Stark MRN: [id number] DOB: 1950-05-10 Today's Date: 09/20/2021   History of Present Illness 71 y/o male s/p R TKA 8/10, h/o L TKA March 2022.    PT Comments    Completes x 1 lap and tx as below.  Pt with overall improved gait today and increased safety/less impulsive.  Overall progressing well towards goals.  SNF remains appropriate per MD request and safety concerns.   Recommendations for follow up therapy are one component of a multi-disciplinary discharge planning process, led by the attending physician.  Recommendations may be updated based on patient status, additional functional criteria and insurance authorization.  Follow Up Recommendations  Skilled nursing-short term rehab (<3 hours/day)     Assistance Recommended at Discharge Intermittent Supervision/Assistance  Patient can return home with the following A little help with walking and/or transfers;A little help with bathing/dressing/bathroom;Assistance with cooking/housework;Direct supervision/assist for medications management;Direct supervision/assist for financial management;Assist for transportation;Help with stairs or ramp for entrance   Equipment Recommendations  None recommended by PT    Recommendations for Other Services       Precautions / Restrictions Precautions Precautions: Fall;Knee Precaution Booklet Issued: Yes (comment) Precaution Comments: HEP Restrictions Weight Bearing Restrictions: Yes RLE Weight Bearing: Weight bearing as tolerated     Mobility  Bed Mobility Overal bed mobility: Needs Assistance Bed Mobility: Supine to Sit     Supine to sit: Min guard          Transfers Overall transfer level: Needs assistance Equipment used: Rolling walker (2 wheels) Transfers: Sit to/from Stand Sit to Stand: Min guard, Min assist, From elevated surface           General transfer comment: CGA mostly but slight min to assist pt with  fwd wt shift. Vcs throughout for reminders ion technique and sequencing. poor carry-over between sessions.    Ambulation/Gait Ambulation/Gait assistance: Min guard, Min assist   Assistive device: Rolling walker (2 wheels) Gait Pattern/deviations: Step-through pattern, Antalgic, Trunk flexed Gait velocity: decreased     General Gait Details: CGA when pt is turning/talking to other in hallway. pt is easily distracted but pleasant. High fall risk due to safety awareness/insight   Stairs             Wheelchair Mobility    Modified Rankin (Stroke Patients Only)       Balance Overall balance assessment: Needs assistance Sitting-balance support: Feet supported Sitting balance-Leahy Scale: Good     Standing balance support: Bilateral upper extremity supported, During functional activity Standing balance-Leahy Scale: Fair                              Cognition Arousal/Alertness: Awake/alert Behavior During Therapy: WFL for tasks assessed/performed, Impulsive Overall Cognitive Status: Within Functional Limits for tasks assessed                                 General Comments: seemed better today with less impulsiveness and increased safety        Exercises Total Joint Exercises Ankle Circles/Pumps: AROM, 10 reps Quad Sets: AROM, 10 reps Gluteal Sets: AROM, 10 reps Heel Slides: AROM, 5 reps Hip ABduction/ADduction: AROM, 10 reps Straight Leg Raises: AAROM, 5 reps Goniometric ROM: 3-95    General Comments        Pertinent Vitals/Pain Pain Assessment Pain Assessment: Faces Faces Pain Scale: Hurts little  more Pain Location: R knee Pain Descriptors / Indicators: Discomfort Pain Intervention(s): Limited activity within patient's tolerance, Monitored during session, Repositioned, Ice applied    Home Living                          Prior Function            PT Goals (current goals can now be found in the care plan  section) Progress towards PT goals: Progressing toward goals    Frequency    BID      PT Plan Current plan remains appropriate    Co-evaluation              AM-PAC PT "6 Clicks" Mobility   Outcome Measure  Help needed turning from your back to your side while in a flat bed without using bedrails?: None Help needed moving from lying on your back to sitting on the side of a flat bed without using bedrails?: A Little Help needed moving to and from a bed to a chair (including a wheelchair)?: A Little Help needed standing up from a chair using your arms (e.g., wheelchair or bedside chair)?: A Little Help needed to walk in hospital room?: A Little Help needed climbing 3-5 steps with a railing? : A Lot 6 Click Score: 18    End of Session   Activity Tolerance: Patient tolerated treatment well Patient left: in bed;with call bell/phone within reach;with bed alarm set;with SCD's reapplied (bone foam + polar care in place) Nurse Communication: Mobility status PT Visit Diagnosis: Muscle weakness (generalized) (M62.81);Ataxic gait (R26.0);Pain Pain - Right/Left: Left Pain - part of body: Knee     Time: 4782-9562 PT Time Calculation (min) (ACUTE ONLY): 17 min  Charges:  $Gait Training: 8-22 mins                   Danielle Dess, PTA 09/20/21, 10:48 AM

## 2021-09-20 NOTE — TOC Progression Note (Signed)
Transition of Care [facility]) - Progression Note    Patient Details  Name: Richard Stark MRN: [id number] Date of Birth: May 05, 1950  Transition of Care [facility]) CM/SW Contact  Truddie Hidden, RN Phone Number: 09/20/2021, 11:25 AM  Clinical Narrative:    Call placed to [facility]. Spoke with Lavonne Chick for update on bed offer. Clinicals still being reviewed.         Expected Discharge Plan and Services                                                 Social Determinants of Health (SDOH) Interventions    Readmission Risk Interventions     No data to display

## 2021-09-20 NOTE — Plan of Care (Signed)
  Problem: Education: Goal: Ability to describe self-care measures that may prevent or decrease complications (Diabetes Survival Skills Education) will improve Outcome: Progressing Goal: Individualized Educational Video(s) Outcome: Progressing   Problem: Coping: Goal: Ability to adjust to condition or change in health will improve Outcome: Progressing   Problem: Nutritional: Goal: Maintenance of adequate nutrition will improve Outcome: Progressing Goal: Progress toward achieving an optimal weight will improve Outcome: Progressing   Problem: Tissue Perfusion: Goal: Adequacy of tissue perfusion will improve Outcome: Progressing   Problem: Skin Integrity: Goal: Risk for impaired skin integrity will decrease Outcome: Progressing   Problem: Education: Goal: Knowledge of the prescribed therapeutic regimen will improve Outcome: Progressing Goal: Individualized Educational Video(s) Outcome: Progressing

## 2021-09-21 ENCOUNTER — Encounter: Payer: Self-pay | Admitting: Orthopedic Surgery

## 2021-09-21 DIAGNOSIS — M1711 Unilateral primary osteoarthritis, right knee: Secondary | ICD-10-CM | POA: Diagnosis not present

## 2021-09-21 LAB — GLUCOSE, CAPILLARY
Glucose-Capillary: 130 mg/dL — ABNORMAL HIGH (ref 70–99)
Glucose-Capillary: 128 mg/dL — ABNORMAL HIGH (ref 70–99)

## 2021-09-21 MED ORDER — ASPIRIN 81 MG PO CHEW
81.0000 mg | CHEWABLE_TABLET | Freq: Two times a day (BID) | ORAL | 0 refills | Status: AC
Start: 2021-09-21 — End: 2021-10-21

## 2021-09-21 NOTE — Progress Notes (Signed)
Physical Therapy Treatment Patient Details Name: Richard Stark MRN: [id number] DOB: 07/17/1950 Today's Date: 09/21/2021   History of Present Illness 71 y/o male s/p R TKA 8/10, h/o L TKA March 2022.    PT Comments    Pt did well with PT session this AM.  He was able to circumambulate the nurses station and negotiate up/down 4 steps X 2 without direct physical assist.  Pt did need repeated cuing and explanation but generally did well.  He assures me he will have people with him nearly 24/7, even when niece is at work and is doing well enough where he should be able to safely manage at home.  Pt's HR in the 60-70s and O2 in the high 90s (on room air) t/o the effort.     Recommendations for follow up therapy are one component of a multi-disciplinary discharge planning process, led by the attending physician.  Recommendations may be updated based on patient status, additional functional criteria and insurance authorization.  Follow Up Recommendations  Home health PT Can patient physically be transported by private vehicle: Yes   Assistance Recommended at Discharge Intermittent Supervision/Assistance  Patient can return home with the following A little help with walking and/or transfers;A little help with bathing/dressing/bathroom;Assistance with cooking/housework;Direct supervision/assist for medications management;Direct supervision/assist for financial management;Assist for transportation;Help with stairs or ramp for entrance   Equipment Recommendations       Recommendations for Other Services       Precautions / Restrictions Precautions Precautions: Fall;Knee Restrictions RLE Weight Bearing: Weight bearing as tolerated     Mobility  Bed Mobility Overal bed mobility: Modified Independent Bed Mobility: Supine to Sit     Supine to sit: Supervision Sit to supine: Supervision   General bed mobility comments: Pt able to get himself to EOB w/o assist    Transfers Overall  transfer level: Needs assistance Equipment used: Rolling walker (2 wheels) Transfers: Sit to/from Stand Sit to Stand: From elevated surface, Supervision           General transfer comment: Pt was able to rise to standing without phyiscal assist, minimal cuing for hand placement and set up    Ambulation/Gait Ambulation/Gait assistance: Min guard Gait Distance (Feet): 250 Feet Assistive device: Rolling walker (2 wheels)         General Gait Details: Pt was able to circumambulate the nurses' station w/o rest break.  Showed steadily improving cadence with inrceased distance.   Stairs Stairs: Yes Stairs assistance: Min guard Stair Management: One rail Left Number of Stairs: 8 General stair comments: up/down 4 steps X2, pt needed consistent cuing   Wheelchair Mobility    Modified Rankin (Stroke Patients Only)       Balance Overall balance assessment: Needs assistance Sitting-balance support: Feet supported Sitting balance-Leahy Scale: Good       Standing balance-Leahy Scale: Good Standing balance comment: Pt with no LOBs t/o the effort of ambulation, showed appropriate reliance on AD, occasionally surface surfing with single UE with slow but safe effort                            Cognition Arousal/Alertness: Awake/alert Behavior During Therapy: WFL for tasks assessed/performed, Impulsive Overall Cognitive Status: Within Functional Limits for tasks assessed                                 General Comments: Still  requiring increased cuing for general mobility, but more intuitively appropriate today        Exercises Total Joint Exercises Ankle Circles/Pumps: AROM, 10 reps Quad Sets: AROM, 10 reps Heel Slides: 10 reps, Strengthening (with resisted leg ext) Hip ABduction/ADduction: Strengthening, 10 reps Straight Leg Raises: AROM, 10 reps Knee Flexion: PROM, 5 reps Goniometric ROM: 0-92    General Comments        Pertinent  Vitals/Pain Pain Assessment Pain Assessment: 0-10 Pain Score: 3  Pain Location: R knee    Home Living                          Prior Function            PT Goals (current goals can now be found in the care plan section) Progress towards PT goals: Progressing toward goals    Frequency    BID      PT Plan Discharge plan needs to be updated    Co-evaluation              AM-PAC PT "6 Clicks" Mobility   Outcome Measure  Help needed turning from your back to your side while in a flat bed without using bedrails?: None Help needed moving from lying on your back to sitting on the side of a flat bed without using bedrails?: A Little Help needed moving to and from a bed to a chair (including a wheelchair)?: A Little Help needed standing up from a chair using your arms (e.g., wheelchair or bedside chair)?: A Little Help needed to walk in hospital room?: A Little Help needed climbing 3-5 steps with a railing? : A Little 6 Click Score: 19    End of Session Equipment Utilized During Treatment: Gait belt Activity Tolerance: Patient tolerated treatment well Patient left: with bed alarm set;with call bell/phone within reach Nurse Communication: Mobility status PT Visit Diagnosis: Muscle weakness (generalized) (M62.81);Ataxic gait (R26.0);Pain Pain - Right/Left: Left Pain - part of body: Knee     Time: 0825-0856 PT Time Calculation (min) (ACUTE ONLY): 31 min  Charges:  $Gait Training: 8-22 mins $Therapeutic Exercise: 8-22 mins                     Malachi Pro, DPT 09/21/2021, 10:29 AM

## 2021-09-21 NOTE — TOC Transition Note (Signed)
Transition of Care [facility]) - CM/SW Discharge Note   Patient Details  Name: Richard Stark MRN: [id number] Date of Birth: 01-Dec-1950  [facility] CM/SW Contact:  Truddie Hidden, RN Phone Number: 09/21/2021, 12:01 PM   Clinical Narrative:    Spoke with patient at his bedside. Patient will discharge home today. His niece will transport him home. Referral sent Felib[facility] Got[facility] and forward to [facility]. Referral accepted by [facility]. Patient advised agrency will contacthim to schedule an appointment at home. Patient stated he hasa RW. 3in1 ordered. TOC signing off.          Patient Goals and CMS Choice        Discharge Placement                       Discharge Plan and Services                                     Social Determinants of Health (SDOH) Interventions     Readmission Risk Interventions     No data to display

## 2021-09-21 NOTE — Progress Notes (Cosign Needed)
Patient is not able to walk the distance required to go the bathroom, or he/she is unable to safely negotiate stairs required to access the bathroom.  A 3in1 BSC will alleviate this problem  

## 2021-09-21 NOTE — Progress Notes (Signed)
   Subjective: 5 Days Post-Op Procedure(s) (LRB): TOTAL KNEE ARTHROPLASTY (Right) Patient reports pain as mild.   Patient is well, and has had no acute complaints or problems Denies any CP, SOB, ABD pain. We will continue therapy today.   Objective: Vital signs in last 24 hours: Temp:  [98 F (36.7 C)-99.2 F (37.3 C)] 98 F (36.7 C) (08/15 0504) Pulse Rate:  [66-71] 66 (08/15 0504) Resp:  [16-20] 20 (08/15 0504) BP: (110-144)/(61-70) 110/61 (08/15 0504) SpO2:  [96 %-99 %] 99 % (08/15 0504)  Intake/Output from previous day: 08/14 0701 - 08/15 0700 In: 720 [P.O.:720] Out: 1951 [Urine:1850; Drains:100] Intake/Output this shift: No intake/output data recorded.  No results for input(s): "HGB" in the last 72 hours.  No results for input(s): "WBC", "RBC", "HCT", "PLT" in the last 72 hours.  No results for input(s): "NA", "K", "CL", "CO2", "BUN", "CREATININE", "GLUCOSE", "CALCIUM" in the last 72 hours.  No results for input(s): "LABPT", "INR" in the last 72 hours.  EXAM General - Patient is Alert, Appropriate, and Oriented Extremity - Neurovascular intact Sensation intact distally Intact pulses distally Dressing - dressing C/D/I and no drainage, provena intact with 125cc drainage Motor Function - intact, moving foot and toes well on exam.   Past Medical History:  Diagnosis Date   Arthritis    Diabetes mellitus without complication (HCC)    GERD (gastroesophageal reflux disease)    OCC NO MEDS   Hypertension    Stroke (HCC) 2008   RIGHT ARM WEAKNESS    Assessment/Plan:   5 Days Post-Op Procedure(s) (LRB): TOTAL KNEE ARTHROPLASTY (Right) Principal Problem:   S/P TKR (total knee replacement) using cement, right  Estimated body mass index is 32.52 kg/m as calculated from the following:   Height as of this encounter: 5\' 6"  (1.676 m).   Weight as of this encounter: 91.4 kg. Advance diet Up with therapy Labs and VSS Pain controlled Good progress with PT CM to  assist with discharge to home with HHPT today if patient can find transportation  DVT Prophylaxis - Lovenox, TED hose, and SCDs Weight-Bearing as tolerated to Right leg   T. , PA-C [facility] 09/21/2021, 8:51 AM

## 2022-06-09 ENCOUNTER — Ambulatory Visit (INDEPENDENT_AMBULATORY_CARE_PROVIDER_SITE_OTHER): Payer: 59 | Admitting: Vascular Surgery

## 2022-06-09 ENCOUNTER — Encounter (INDEPENDENT_AMBULATORY_CARE_PROVIDER_SITE_OTHER): Payer: Self-pay | Admitting: Vascular Surgery

## 2022-06-09 VITALS — BP 120/62 | HR 69 | Resp 16 | Wt 207.2 lb

## 2022-06-09 DIAGNOSIS — M1990 Unspecified osteoarthritis, unspecified site: Secondary | ICD-10-CM | POA: Insufficient documentation

## 2022-06-09 DIAGNOSIS — I6529 Occlusion and stenosis of unspecified carotid artery: Secondary | ICD-10-CM | POA: Insufficient documentation

## 2022-06-09 DIAGNOSIS — E782 Mixed hyperlipidemia: Secondary | ICD-10-CM

## 2022-06-09 DIAGNOSIS — I10 Essential (primary) hypertension: Secondary | ICD-10-CM | POA: Diagnosis not present

## 2022-06-09 DIAGNOSIS — I6523 Occlusion and stenosis of bilateral carotid arteries: Secondary | ICD-10-CM

## 2022-06-09 DIAGNOSIS — M159 Polyosteoarthritis, unspecified: Secondary | ICD-10-CM | POA: Diagnosis not present

## 2022-06-09 DIAGNOSIS — E785 Hyperlipidemia, unspecified: Secondary | ICD-10-CM | POA: Insufficient documentation

## 2022-06-09 NOTE — Progress Notes (Signed)
MRN : [id number]  Richard Stark is a 72 y.o. (11/02/1950) male who presents with chief complaint of check carotid arteries.  History of Present Illness:  The patient is seen for evaluation of carotid stenosis. The carotid stenosis was identified remotely.  Recent duplex ultrasound done at [facility] March 24, 2022 demonstrates 50 to 69% right ICA stenosis and occlusion of the left internal carotid artery both vertebral arteries are antegrade.  The patient denies recent episodes of amaurosis fugax. There is no recent history of TIA symptoms or focal motor deficits. There is no prior documented CVA.  There is no history of migraine headaches. There is no history of seizures.  The patient is taking enteric-coated aspirin 81 mg daily.  No recent shortening of the patient's walking distance or new symptoms consistent with claudication.  No history of rest pain symptoms. No new ulcers or wounds of the lower extremities have occurred.  There is no history of DVT, PE or superficial thrombophlebitis. No recent episodes of angina or shortness of breath documented.    No outpatient medications have been marked as taking for the 06/09/22 encounter (Appointment) with Gilda Crease, Latina Craver, MD.    Past Medical History:  Diagnosis Date   Arthritis    Diabetes mellitus without complication (HCC)    GERD (gastroesophageal reflux disease)    OCC NO MEDS   Hypertension    Stroke [facility]) 2008   RIGHT ARM WEAKNESS    Past Surgical History:  Procedure Laterality Date   COLONOSCOPY     TOTAL KNEE ARTHROPLASTY Left 04/28/2020   Procedure: TOTAL KNEE ARTHROPLASTY;  Surgeon: Kennedy Bucker, MD;  Location: [facility] ORS;  Service: Orthopedics;  Laterality: Left;   TOTAL KNEE ARTHROPLASTY Right 09/16/2021   Procedure: TOTAL KNEE ARTHROPLASTY;  Surgeon: Kennedy Bucker, MD;  Location: [facility] ORS;  Service: Orthopedics;  Laterality: Right;   TRACHEOSTOMY     DUE TO REACTION FROM LISNOPRIL    Social  History Social History   Tobacco Use   Smoking status: Every Day    Packs/day: 0.50    Years: 30.00    Additional pack years: 0.00    Total pack years: 15.00    Types: Cigarettes   Smokeless tobacco: Never  Vaping Use   Vaping Use: Never used  Substance Use Topics   Alcohol use: No   Drug use: Never    Family History No family history on file.  Allergies  Allergen Reactions   Lisinopril Anaphylaxis     REVIEW OF SYSTEMS (Negative unless checked)  Constitutional: [] Weight loss  [] Fever  [] Chills Cardiac: [] Chest pain   [] Chest pressure   [] Palpitations   [] Shortness of breath when laying flat   [] Shortness of breath with exertion. Vascular:  [x] Pain in legs with walking   [] Pain in legs at rest  [] History of DVT   [] Phlebitis   [] Swelling in legs   [] Varicose veins   [] Non-healing ulcers Pulmonary:   [] Uses home oxygen   [] Productive cough   [] Hemoptysis   [] Wheeze  [] COPD   [] Asthma Neurologic:  [] Dizziness   [] Seizures   [] History of stroke   [] History of TIA  [] Aphasia   [] Vissual changes   [] Weakness or numbness in arm   [] Weakness or numbness in leg Musculoskeletal:   [] Joint swelling   [x] Joint pain   [] Low back pain Hematologic:  [] Easy bruising  [] Easy bleeding   [] Hypercoagulable state   [] Anemic Gastrointestinal:  [] Diarrhea   [] Vomiting  [] Gastroesophageal reflux/heartburn   []   Difficulty swallowing. Genitourinary:  [] Chronic kidney disease   [] Difficult urination  [] Frequent urination   [] Blood in urine Skin:  [] Rashes   [] Ulcers  Psychological:  [] History of anxiety   []  History of major depression.  Physical Examination  There were no vitals filed for this visit. There is no height or weight on file to calculate BMI. Gen: WD/WN, NAD Head: [place]/AT, No temporalis wasting.  Ear/Nose/Throat: Hearing grossly intact, nares w/o erythema or drainage Eyes: PER, EOMI, sclera nonicteric.  Neck: Supple, no masses.  No bruit or JVD.  Pulmonary:  Good air movement, no  audible wheezing, no use of accessory muscles.  Cardiac: RRR, normal S1, S2, no Murmurs. Vascular:  carotid bruit noted Vessel Right Left  Radial Palpable Palpable  Carotid  Palpable  Palpable  Subclav  Palpable Palpable  Gastrointestinal: soft, non-distended. No guarding/no peritoneal signs.  Musculoskeletal: M/S 5/5 throughout.  No visible deformity.  Neurologic: CN 2-12 intact. Pain and light touch intact in extremities.  Symmetrical.  Speech is fluent. Motor exam as listed above. Psychiatric: Judgment intact, Mood & affect appropriate for pt's clinical situation. Dermatologic: No rashes or ulcers noted.  No changes consistent with cellulitis.   CBC Lab Results  Component Value Date   WBC 6.4 09/18/2021   HGB 11.5 (L) 09/18/2021   HCT 33.6 (L) 09/18/2021   MCV 86.2 09/18/2021   PLT 167 09/18/2021    BMET    Component Value Date/Time   NA 137 09/17/2021 0420   NA 136 03/20/2013 0451   K 3.4 (L) 09/17/2021 0420   K 3.5 03/20/2013 0451   CL 102 09/17/2021 0420   CL 100 03/20/2013 0451   CO2 27 09/17/2021 0420   CO2 29 03/20/2013 0451   GLUCOSE 83 09/17/2021 0420   GLUCOSE 146 (H) 03/20/2013 0451   BUN 19 09/17/2021 0420   BUN 15 03/20/2013 0451   CREATININE 1.36 (H) 09/17/2021 0420   CREATININE 1.34 (H) 03/20/2013 0451   CALCIUM 8.8 (L) 09/17/2021 0420   CALCIUM 9.4 03/20/2013 0451   GFRNONAA 56 (L) 09/17/2021 0420   GFRNONAA 56 (L) 03/20/2013 0451   GFRAA >60 03/20/2013 0451   CrCl cannot be calculated (Patient's most recent lab result is older than the maximum 21 days allowed.).  COAG Lab Results  Component Value Date   INR 1.0 06/08/2012   INR 1.0 04/07/2012    Radiology No results found.   Assessment/Plan 1. Bilateral carotid artery stenosis Recommend:  Given the patient's asymptomatic subcritical stenosis no further invasive testing or surgery at this time.  Duplex ultrasound shows 50 to 69% right ICA stenosis and occlusion of the left internal  carotid artery both vertebral arteries are antegrade.  Continue antiplatelet therapy as prescribed Continue management of CAD, HTN and Hyperlipidemia Healthy heart diet,  encouraged exercise at least 4 times per week Follow up in 6 months with duplex ultrasound and physical exam  - VAS US CAROTID; Future  2. Essential hypertension Continue antihypertensive medications as already ordered, these medications have been reviewed and there are no changes at this time.  3. Mixed hyperlipidemia Continue statin as ordered and reviewed, no changes at this time  4. Primary osteoarthritis involving multiple joints Continue NSAID medications as already ordered, these medications have been reviewed and there are no changes at this time.  Continued activity and therapy was stressed.    Levora Dredge, MD  06/09/2022 10:16 AM

## 2022-06-11 ENCOUNTER — Encounter (INDEPENDENT_AMBULATORY_CARE_PROVIDER_SITE_OTHER): Payer: Self-pay | Admitting: Vascular Surgery

## 2022-07-07 IMAGING — CT CT KNEE*L* W/O CM
3 of 8 series · 10 of 36 positions shown, 11 images · non-contrast
Comparison: None.

CLINICAL DATA: Chronic left knee pain.  Preoperative planning

EXAM:
CT OF THE LEFT KNEE WITHOUT CONTRAST
TECHNIQUE: Multidetector CT imaging of the left knee was performed according to
the standard protocol. Multiplanar CT image reconstructions were
also generated. Protocol included axial bone window imaging through
the left hip and left ankle.

[Series 3: axial st knee 2.00 · axial · 0.34mm/px · z∈[+848,+1024]mm · 3 of 177 slices shown, 4 images]
[im 45/177  soft-tissue]
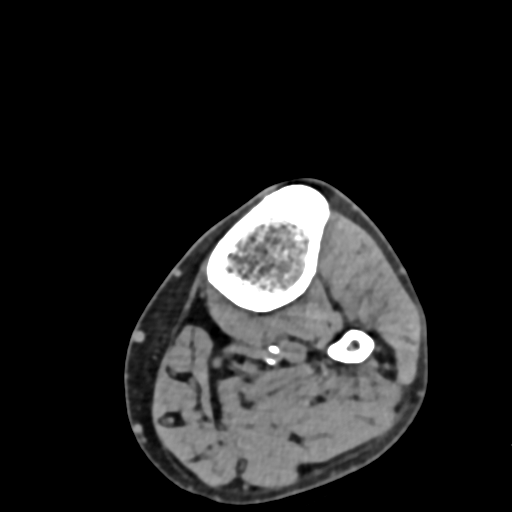
[im 45/177  bone]
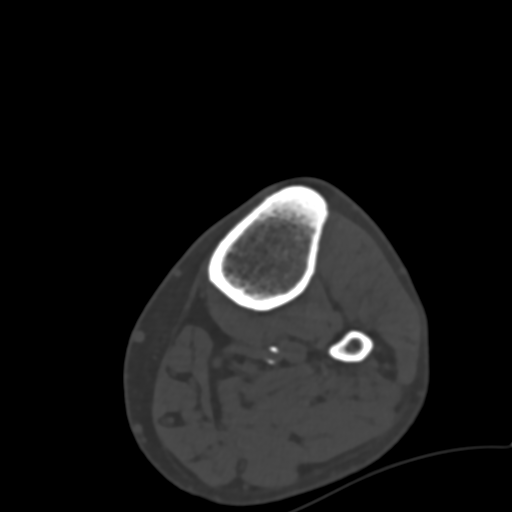
[im 89/177  bone]
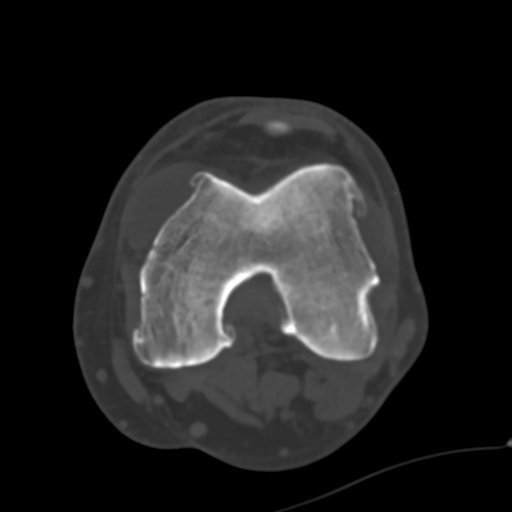
[im 133/177  bone]
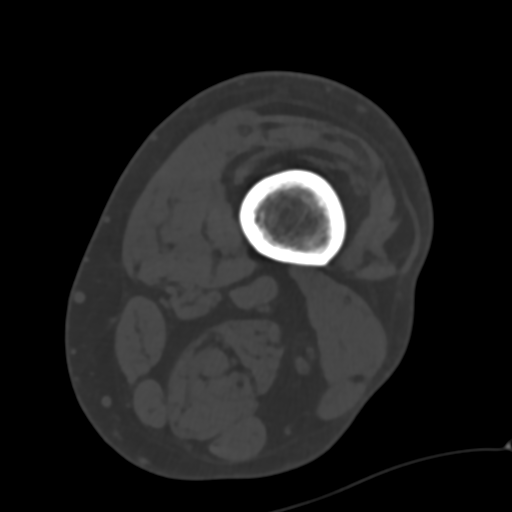

[Series 4: coronal bone knee 2.00 cor · coronal · 0.34mm/px · 1 of 87 slices shown]
[im 44/87  bone]
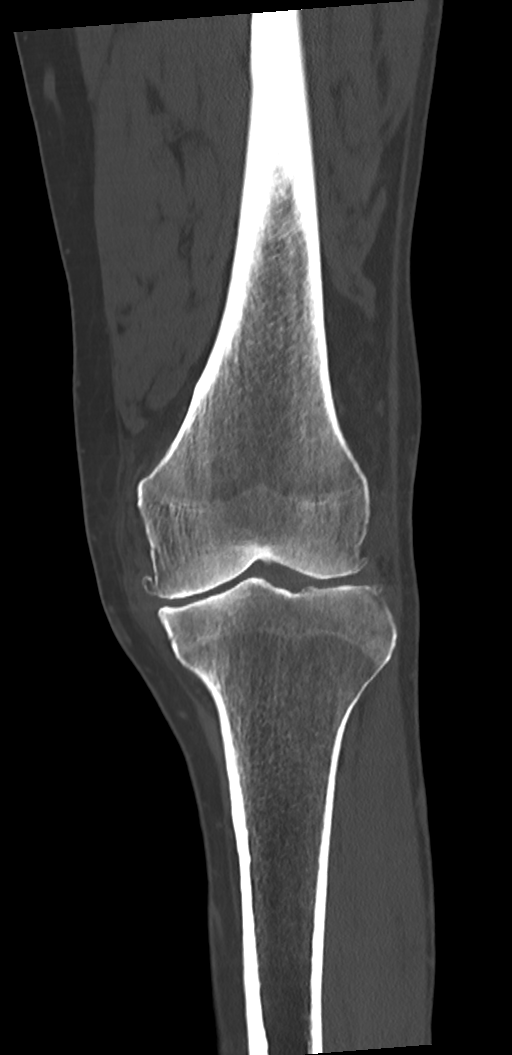

[Series 8: sagittal bone knee 2.00 sag · sagittal · 0.34mm/px · 6 of 87 slices shown]
[im 13/87  soft-tissue]
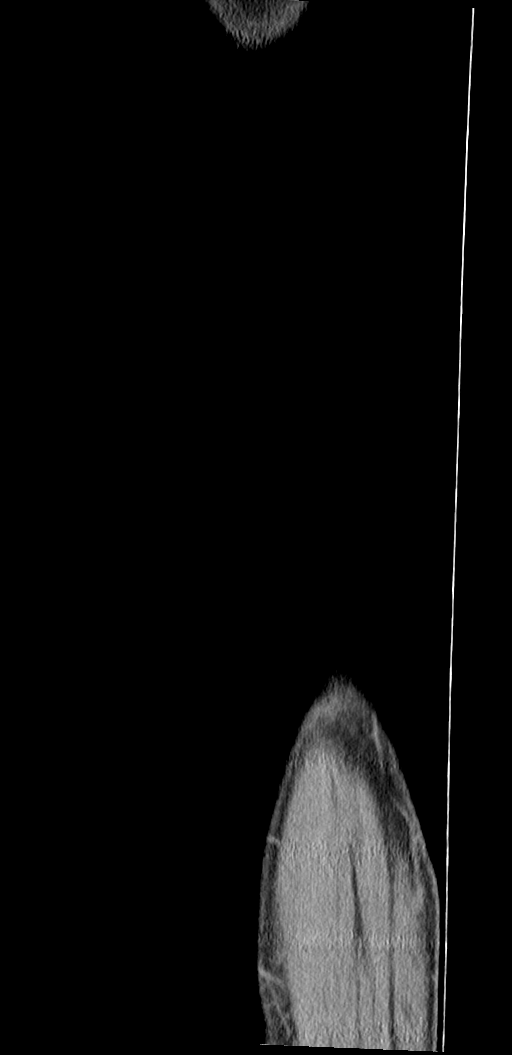
[im 15/87  bone]
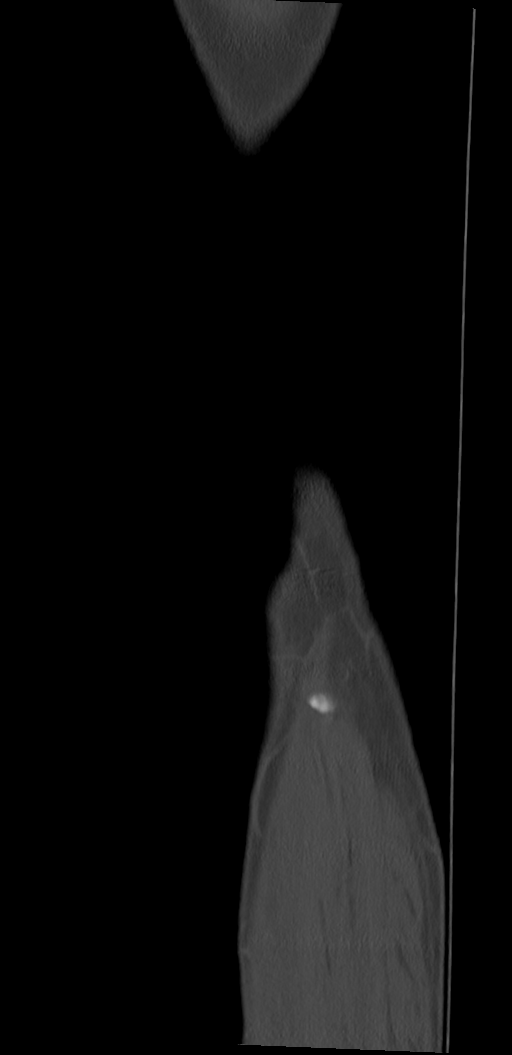
[im 29/87  bone]
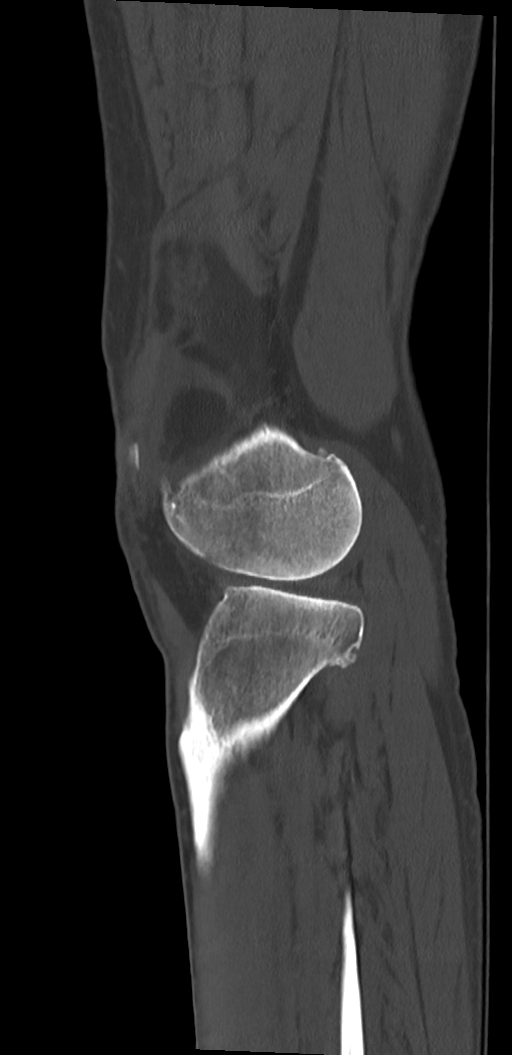
[im 44/87  bone]
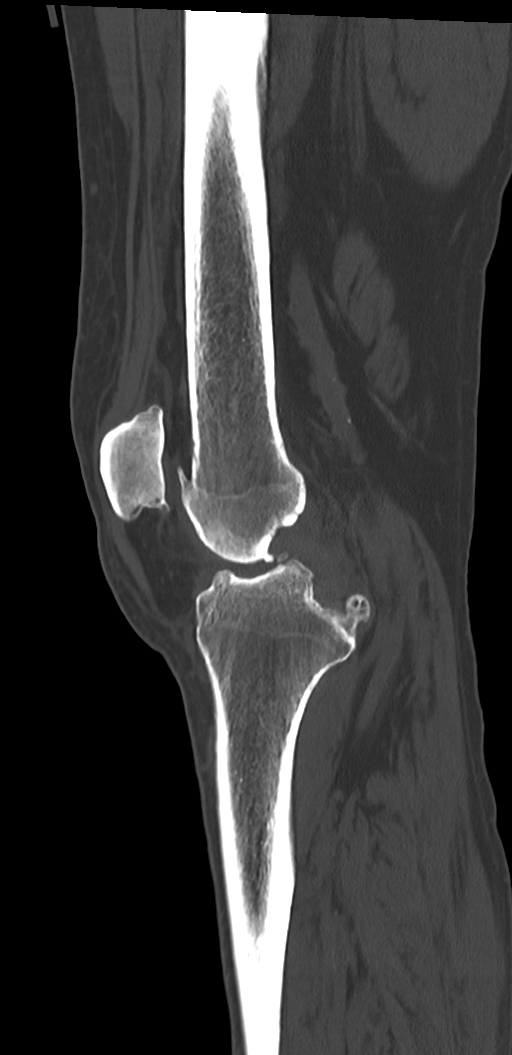
[im 58/87  bone]
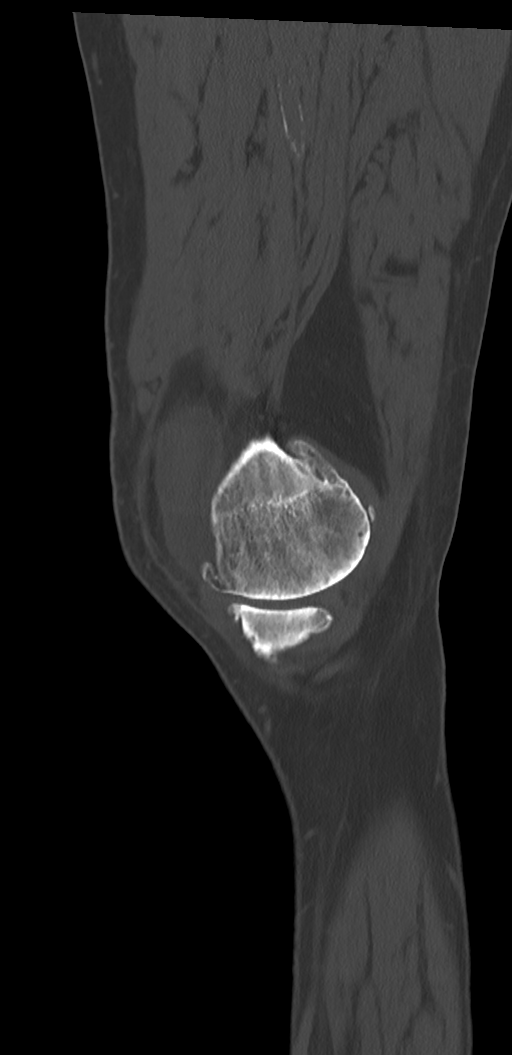
[im 72/87  bone]
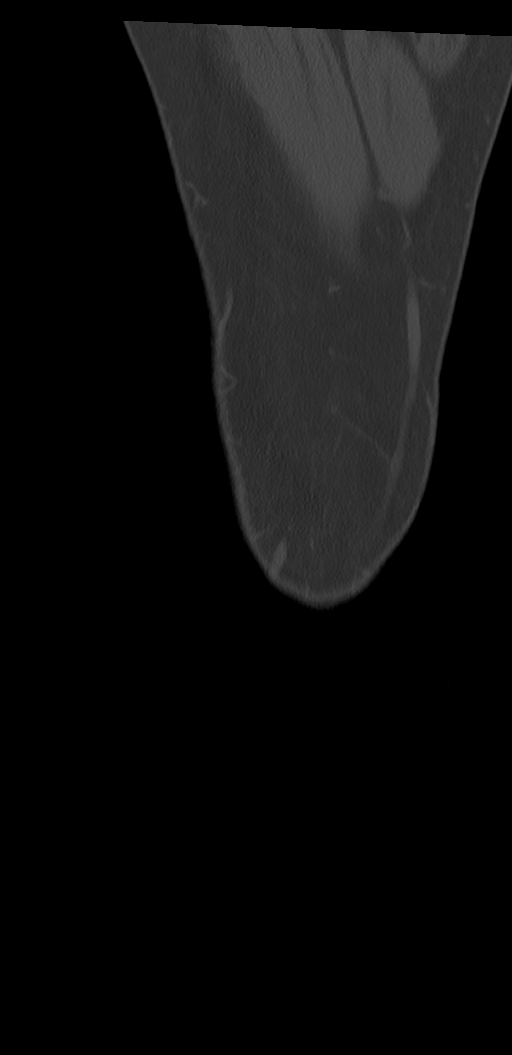

[10 of 36 positions shown; findings below may reference images not displayed]

FINDINGS: Bones/Joint/Cartilage

Left knee: No acute fracture. No dislocation. Moderate to severe
tricompartmental osteoarthritis, most prominent within the medial
compartment with near complete joint space loss, subchondral
sclerosis/cystic change, and marginal osteophyte formation. Small to
moderate-sized knee joint effusion without fat-fluid or hematocrit
level.

Left hip: No acute fracture or dislocation. Left hip joint space is
relatively preserved. There are is mild subchondral cyst formation
in the superior acetabulum. No large hip joint effusion.

Left ankle: No acute fracture or dislocation. No talar dome OCL is
evident. No significant arthropathy. Node tibiotalar joint effusion
is evident.

Ligaments

Suboptimally assessed by CT.

Muscles and Tendons

Musculotendinous structures appear within normal limits within the
limitations of CT.

Soft tissues

Scattered atherosclerotic vascular calcifications. No soft tissue
edema or fluid collections.
IMPRESSION: Moderate-to-severe tricompartmental osteoarthritis of the left knee,
most prominent within the medial compartment.

## 2022-09-23 IMAGING — DX DG KNEE 1-2V*L*
2 series · 2 of 2 positions shown · non-contrast
Comparison: CT left knee 02/10/2020.

CLINICAL DATA: Status post left knee replacement today.

EXAM:
LEFT KNEE - 1-2 VIEW

[knee ap]
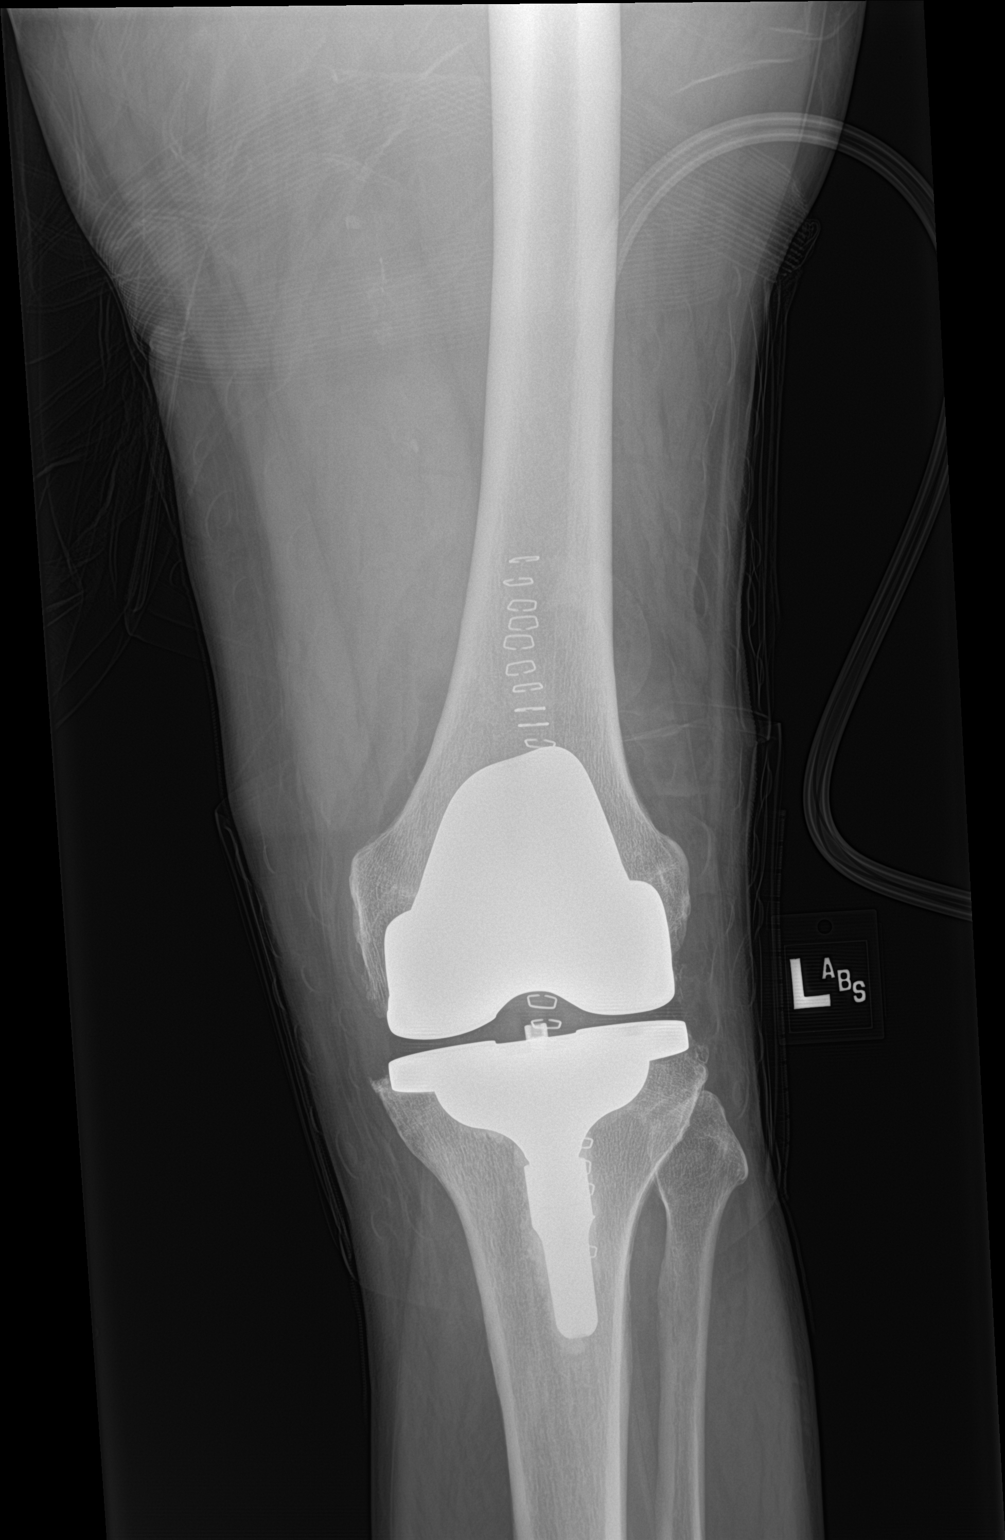

[knee lat]
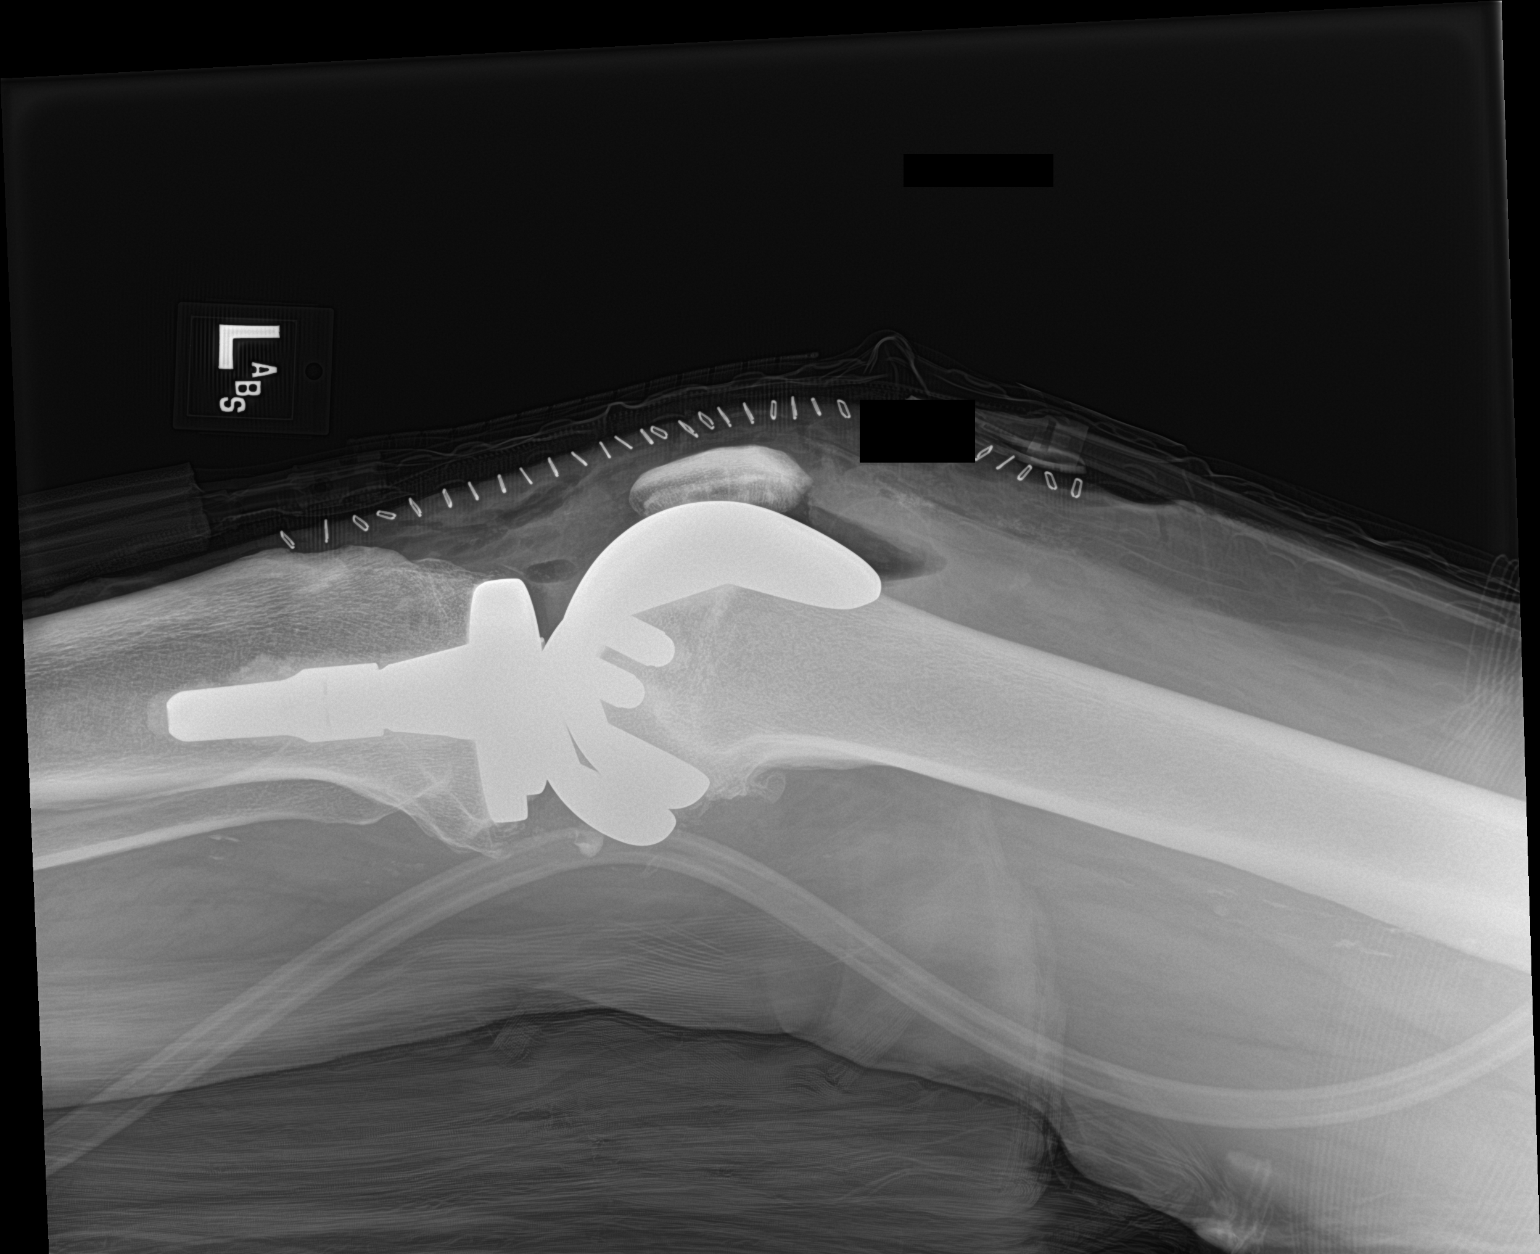

[2 of 2 positions shown; findings below may reference images not displayed]

FINDINGS: New total knee arthroplasty is in place. Surgical drain and staples
are noted. No acute finding.
IMPRESSION: Status post left knee replacement.  No acute abnormality.

## 2022-12-14 ENCOUNTER — Ambulatory Visit (INDEPENDENT_AMBULATORY_CARE_PROVIDER_SITE_OTHER): Payer: 59 | Admitting: Nurse Practitioner

## 2022-12-14 ENCOUNTER — Encounter (INDEPENDENT_AMBULATORY_CARE_PROVIDER_SITE_OTHER): Payer: 59

## 2023-01-23 ENCOUNTER — Other Ambulatory Visit (INDEPENDENT_AMBULATORY_CARE_PROVIDER_SITE_OTHER): Payer: Self-pay | Admitting: Nurse Practitioner

## 2023-01-23 DIAGNOSIS — I6523 Occlusion and stenosis of bilateral carotid arteries: Secondary | ICD-10-CM

## 2023-01-24 ENCOUNTER — Encounter (INDEPENDENT_AMBULATORY_CARE_PROVIDER_SITE_OTHER): Payer: Self-pay | Admitting: Nurse Practitioner

## 2023-01-24 ENCOUNTER — Ambulatory Visit (INDEPENDENT_AMBULATORY_CARE_PROVIDER_SITE_OTHER): Payer: 59

## 2023-01-24 ENCOUNTER — Ambulatory Visit (INDEPENDENT_AMBULATORY_CARE_PROVIDER_SITE_OTHER): Payer: 59 | Admitting: Nurse Practitioner

## 2023-01-24 VITALS — BP 113/68 | HR 73 | Resp 16 | Wt 197.2 lb

## 2023-01-24 DIAGNOSIS — I10 Essential (primary) hypertension: Secondary | ICD-10-CM

## 2023-01-24 DIAGNOSIS — E782 Mixed hyperlipidemia: Secondary | ICD-10-CM

## 2023-01-24 DIAGNOSIS — I6523 Occlusion and stenosis of bilateral carotid arteries: Secondary | ICD-10-CM

## 2023-01-24 NOTE — Progress Notes (Signed)
Subjective:    Patient ID: Richard Stark, male    DOB: 1950/10/18, 72 y.o.   MRN: [id number] Chief Complaint  Patient presents with   Follow-up    6 month carotid follow up    The patient is seen for evaluation of carotid stenosis. The carotid stenosis was identified remotely.  Recent duplex ultrasound done at [facility] March 24, 2022 demonstrates 50 to 69% right ICA stenosis and occlusion of the left internal carotid artery both vertebral arteries are antegrade.   The patient denies recent episodes of amaurosis fugax. There is no recent history of TIA symptoms or focal motor deficits. There is no prior documented CVA.   There is no history of migraine headaches. There is no history of seizures.   The patient is taking enteric-coated aspirin 81 mg daily.   No recent shortening of the patient's walking distance or new symptoms consistent with claudication.  No history of rest pain symptoms. No new ulcers or wounds of the lower extremities have occurred.   There is no history of DVT, PE or superficial thrombophlebitis. No recent episodes of angina or shortness of breath documented.     Today noninvasive studies show 1 to 39% stenosis of the right ICA with occlusion of the left ICA. Both vertebral arteries are antegrade.  No significant subclavian artery stenosis noted bilaterally.     Review of Systems  Musculoskeletal:  Positive for arthralgias.  All other systems reviewed and are negative.      Objective:   Physical Exam Vitals reviewed.  HENT:     Head: Normocephalic.  Neck:     Vascular: Carotid bruit present.  Cardiovascular:     Rate and Rhythm: Normal rate.     Pulses:          Radial pulses are 2+ on the right side and 2+ on the left side.  Pulmonary:     Effort: Pulmonary effort is normal.  Skin:    General: Skin is warm and dry.  Neurological:     Mental Status: He is alert.     BP 113/68   Pulse 73   Resp 16   Wt 197 lb 3.2 oz (89.4 kg)    BMI 31.83 kg/m   Past Medical History:  Diagnosis Date   Arthritis    Diabetes mellitus without complication (HCC)    GERD (gastroesophageal reflux disease)    OCC NO MEDS   Hypertension    Stroke (HCC) 2008   RIGHT ARM WEAKNESS    Social History   Socioeconomic History   Marital status: Divorced    Spouse name: Not on file   Number of children: Not on file   Years of education: Not on file   Highest education level: Not on file  Occupational History   Not on file  Tobacco Use   Smoking status: Every Day    Current packs/day: 0.50    Average packs/day: 0.5 packs/day for 30.0 years (15.0 ttl pk-yrs)    Types: Cigarettes   Smokeless tobacco: Never  Vaping Use   Vaping status: Never Used  Substance and Sexual Activity   Alcohol use: No   Drug use: Never   Sexual activity: Not on file  Other Topics Concern   Not on file  Social History Narrative   Not on file   Social Drivers of Health   Financial Resource Strain: Medium Risk (01/04/2023)   Received from [facility]   Overall Financial Resource Strain (CARDIA)  Difficulty of Paying Living Expenses: Somewhat hard  Food Insecurity: No Food Insecurity (01/04/2023)   Received from [facility]   Hunger Vital Sign    Worried About Running Out of Food in the Last Year: Never true    Ran Out of Food in the Last Year: Never true  Transportation Needs: No Transportation Needs (01/04/2023)   Received from [facility] - Transportation    In the past 12 months, has lack of transportation kept you from medical appointments or from getting medications?: No    Lack of Transportation (Non-Medical): No  Physical Activity: Inactive (01/18/2023)   Received from [facility]   Exercise Vital Sign    Days of Exercise per Week: 0 days    Minutes of Exercise per Session: 0 min  Stress: No Stress Concern Present (01/18/2023)   Received from [facility] - Occupational Stress Questionnaire    Feeling of Stress : Not at all  Social Connections: Not on file  Intimate Partner Violence: Not At Risk (01/18/2023)   Received from [facility]   Humiliation, Afraid, Rape, and Kick questionnaire    Fear of Current or Ex-Partner: No    Emotionally Abused: No    Physically Abused: No    Sexually Abused: No    Past Surgical History:  Procedure Laterality Date   COLONOSCOPY     TOTAL KNEE ARTHROPLASTY Left 04/28/2020   Procedure: TOTAL KNEE ARTHROPLASTY;  Surgeon: Kennedy Bucker, MD;  Location: [facility] ORS;  Service: Orthopedics;  Laterality: Left;   TOTAL KNEE ARTHROPLASTY Right 09/16/2021   Procedure: TOTAL KNEE ARTHROPLASTY;  Surgeon: Kennedy Bucker, MD;  Location: [facility] ORS;  Service: Orthopedics;  Laterality: Right;   TRACHEOSTOMY     DUE TO REACTION FROM LISNOPRIL    Family History  Problem Relation Age of Onset   Heart attack Mother     Allergies  Allergen Reactions   Lisinopril Anaphylaxis and Shortness Of Breath    Throat closes had to be tracheid.   Angioedema requiring tracheostomy       Latest Ref Rng & Units 09/18/2021    4:30 AM 09/17/2021    4:20 AM 09/06/2021   10:58 AM  CBC  WBC 4.0 - 10.5 K/uL 6.4  6.8  5.0   Hemoglobin 13.0 - 17.0 g/dL 81.1  91.4  78.2   Hematocrit 39.0 - 52.0 % 33.6  37.7  45.4   Platelets 150 - 400 K/uL 167  214  210       CMP     Component Value Date/Time   NA 137 09/17/2021 0420   NA 136 03/20/2013 0451   K 3.4 (L) 09/17/2021 0420   K 3.5 03/20/2013 0451   CL 102 09/17/2021 0420   CL 100 03/20/2013 0451   CO2 27 09/17/2021 0420   CO2 29 03/20/2013 0451   GLUCOSE 83 09/17/2021 0420   GLUCOSE 146 (H) 03/20/2013 0451   BUN 19 09/17/2021 0420   BUN 15 03/20/2013 0451   CREATININE 1.36 (H) 09/17/2021 0420   CREATININE 1.34 (H) 03/20/2013 0451   CALCIUM 8.8 (L) 09/17/2021 0420   CALCIUM 9.4 03/20/2013 0451   PROT 7.4 09/06/2021 1058   PROT 9.1  (H) 03/17/2013 1757   ALBUMIN 4.3 09/06/2021 1058   ALBUMIN 4.5 03/17/2013 1757   AST 23 09/06/2021 1058   AST 26 03/17/2013 1757   ALT 23 09/06/2021 1058  ALT 52 03/17/2013 1757   ALKPHOS 46 09/06/2021 1058   ALKPHOS 86 03/17/2013 1757   BILITOT 1.1 09/06/2021 1058   BILITOT 0.9 03/17/2013 1757   GFRNONAA 56 (L) 09/17/2021 0420   GFRNONAA 56 (L) 03/20/2013 0451     No results found.     Assessment & Plan:   1. Bilateral carotid artery stenosis (Primary) Recommend:  Given the patient's asymptomatic subcritical stenosis no further invasive testing or surgery at this time.  Duplex ultrasound shows 1 to 39% stenosis of the right and occlusion of the left.  This is a difference in the previous studies and that the stenosis is noted to be lower on the right.  We had a discussion regarding occlusion of internal carotid arteries.  I discussed that unfortunately once an artery is occluded we are unable to intervene once it is reach 100% due to the significant risk of stroke.  Continue antiplatelet therapy as prescribed Continue management of CAD, HTN and Hyperlipidemia Healthy heart diet,  encouraged exercise at least 4 times per week  Follow up in 6 months with duplex ultrasound and physical exam   2. Essential hypertension Continue antihypertensive medications as already ordered, these medications have been reviewed and there are no changes at this time.  3. Mixed hyperlipidemia Continue statin as ordered and reviewed, no changes at this time   Current Outpatient Medications on File Prior to Visit  Medication Sig Dispense Refill   amLODipine (NORVASC) 10 MG tablet Take 10 mg by mouth every morning.     atorvastatin (LIPITOR) 40 MG tablet Take 40 mg by mouth daily.     bisacodyl (DULCOLAX) 5 MG EC tablet Take 1 tablet (5 mg total) by mouth daily as needed for moderate constipation. 30 tablet 0   carvedilol (COREG) 25 MG tablet Take 25 mg by mouth 2 (two) times daily with a  meal.     chlorthalidone (HYGROTON) 25 MG tablet Take 25 mg by mouth daily.     clopidogrel (PLAVIX) 75 MG tablet Take 75 mg by mouth daily.     Continuous Glucose Sensor (FREESTYLE LIBRE 14 DAY SENSOR) MISC by Does not apply route.     docusate sodium (COLACE) 100 MG capsule Take 1 capsule (100 mg total) by mouth 2 (two) times daily. (Patient taking differently: Take 100 mg by mouth daily as needed.) 10 capsule 0   Dulaglutide (TRULICITY) 0.75 MG/0.5ML SOPN Inject 0.75 mg into the skin once a week.     empagliflozin (JARDIANCE) 25 MG TABS tablet Take 25 mg by mouth daily.     famotidine (PEPCID) 40 MG tablet Take 40 mg by mouth daily.     gabapentin (NEURONTIN) 300 MG capsule Take 300 mg by mouth 2 (two) times daily.     hydrALAZINE (APRESOLINE) 25 MG tablet Take 25 mg by mouth 3 (three) times daily.     hydrOXYzine (ATARAX) 10 MG tablet Take 10 mg by mouth 3 (three) times daily as needed for itching.     LANTUS SOLOSTAR 100 UNIT/ML Solostar Pen Inject 36 Units into the skin at bedtime.     metFORMIN (GLUCOPHAGE) 1000 MG tablet Take 1,000 mg by mouth in the morning and at bedtime.     nicotine (NICODERM CQ - DOSED IN MG/24 HOURS) 14 mg/24hr patch Place 14 mg onto the skin daily.     traMADol (ULTRAM) 50 MG tablet Take 1 tablet (50 mg total) by mouth every 6 (six) hours. 30 tablet 0   traZODone (DESYREL)  50 MG tablet Take 50 mg by mouth at bedtime.     HYDROcodone-acetaminophen (NORCO/VICODIN) 5-325 MG tablet Take 1-2 tablets by mouth every 4 (four) hours as needed for moderate pain (pain score 4-6). (Patient not taking: Reported on 06/09/2022) 40 tablet 0   methocarbamol (ROBAXIN) 500 MG tablet Take 1 tablet (500 mg total) by mouth every 6 (six) hours as needed for muscle spasms. (Patient not taking: Reported on 06/09/2022) 30 tablet 0   senna-docusate (SENOKOT-S) 8.6-50 MG tablet Take 1 tablet by mouth at bedtime as needed for mild constipation. (Patient not taking: Reported on 06/09/2022)     VICTOZA  18 MG/3ML SOPN Inject 1.8 mg into the skin every morning. (Patient not taking: Reported on 06/09/2022)     No current facility-administered medications on file prior to visit.    There are no Patient Instructions on file for this visit. No follow-ups on file.   Georgiana Spinner, NP

## 2023-01-25 ENCOUNTER — Encounter (INDEPENDENT_AMBULATORY_CARE_PROVIDER_SITE_OTHER): Payer: Self-pay | Admitting: Nurse Practitioner

## 2023-07-21 ENCOUNTER — Other Ambulatory Visit (INDEPENDENT_AMBULATORY_CARE_PROVIDER_SITE_OTHER): Payer: Self-pay | Admitting: Nurse Practitioner

## 2023-07-21 DIAGNOSIS — I6523 Occlusion and stenosis of bilateral carotid arteries: Secondary | ICD-10-CM

## 2023-07-25 ENCOUNTER — Ambulatory Visit (INDEPENDENT_AMBULATORY_CARE_PROVIDER_SITE_OTHER): Payer: 59 | Admitting: Nurse Practitioner

## 2023-07-25 ENCOUNTER — Ambulatory Visit (INDEPENDENT_AMBULATORY_CARE_PROVIDER_SITE_OTHER): Payer: 59

## 2023-07-25 ENCOUNTER — Encounter (INDEPENDENT_AMBULATORY_CARE_PROVIDER_SITE_OTHER): Payer: Self-pay | Admitting: Nurse Practitioner

## 2023-07-25 VITALS — BP 136/75 | HR 68 | Resp 16 | Wt 196.2 lb

## 2023-07-25 DIAGNOSIS — I6523 Occlusion and stenosis of bilateral carotid arteries: Secondary | ICD-10-CM

## 2023-07-25 DIAGNOSIS — I10 Essential (primary) hypertension: Secondary | ICD-10-CM

## 2023-07-25 DIAGNOSIS — E782 Mixed hyperlipidemia: Secondary | ICD-10-CM | POA: Diagnosis not present

## 2023-07-25 NOTE — Progress Notes (Signed)
 Subjective:    Patient ID: Richard Stark, male    DOB: 01/24/1951, 73 y.o.   MRN: [id number] No chief complaint on file.   The patient is seen for evaluation of carotid stenosis. The carotid stenosis was identified remotely.  Recent duplex ultrasound done at [facility] March 24, 2022 demonstrates 50 to 69% right ICA stenosis and occlusion of the left internal carotid artery both vertebral arteries are antegrade.   The patient denies recent episodes of amaurosis fugax. There is no recent history of TIA symptoms or focal motor deficits. There is no prior documented CVA.   There is no history of migraine headaches. There is no history of seizures.   The patient is taking enteric-coated aspirin  81 mg daily.   No recent shortening of the patient's walking distance or new symptoms consistent with claudication.  No history of rest pain symptoms. No new ulcers or wounds of the lower extremities have occurred.   There is no history of DVT, PE or superficial thrombophlebitis. No recent episodes of angina or shortness of breath documented.     Today noninvasive studies show 1 to 39% stenosis of the right ICA with occlusion of the left ICA. Both vertebral arteries are antegrade.  No significant subclavian artery stenosis noted bilaterally.  No significant change from previous study 6 months ago     Review of Systems  Musculoskeletal:  Positive for arthralgias.  All other systems reviewed and are negative.      Objective:   Physical Exam Vitals reviewed.  HENT:     Head: Normocephalic.  Neck:     Vascular: Carotid bruit present.   Cardiovascular:     Rate and Rhythm: Normal rate.     Pulses:          Radial pulses are 2+ on the right side and 2+ on the left side.  Pulmonary:     Effort: Pulmonary effort is normal.   Skin:    General: Skin is warm and dry.   Neurological:     Mental Status: He is alert.     There were no vitals taken for this visit.  Past Medical  History:  Diagnosis Date   Arthritis    Diabetes mellitus without complication (HCC)    GERD (gastroesophageal reflux disease)    OCC NO MEDS   Hypertension    Stroke (HCC) 2008   RIGHT ARM WEAKNESS    Social History   Socioeconomic History   Marital status: Divorced    Spouse name: Not on file   Number of children: Not on file   Years of education: Not on file   Highest education level: Not on file  Occupational History   Not on file  Tobacco Use   Smoking status: Every Day    Current packs/day: 0.50    Average packs/day: 0.5 packs/day for 30.0 years (15.0 ttl pk-yrs)    Types: Cigarettes   Smokeless tobacco: Never  Vaping Use   Vaping status: Never Used  Substance and Sexual Activity   Alcohol use: No   Drug use: Never   Sexual activity: Not on file  Other Topics Concern   Not on file  Social History Narrative   Not on file   Social Drivers of Health   Financial Resource Strain: Medium Risk (01/04/2023)   Received from [facility]   Overall Financial Resource Strain (CARDIA)    Difficulty of Paying Living Expenses: Somewhat hard  Food Insecurity: No Food Insecurity (01/04/2023)  Received from [facility]   Hunger Vital Sign    Within the past 12 months, you worried that your food would run out before you got the money to buy more.: Never true    Within the past 12 months, the food you bought just didn't last and you didn't have money to get more.: Never true  Transportation Needs: No Transportation Needs (01/04/2023)   Received from [facility] - Transportation    In the past 12 months, has lack of transportation kept you from medical appointments or from getting medications?: No    Lack of Transportation (Non-Medical): No  Physical Activity: Inactive (01/18/2023)   Received from [facility]   Exercise Vital Sign    On average, how many days per week do you engage in moderate to strenuous  exercise (like a brisk walk)?: 0 days    On average, how many minutes do you engage in exercise at this level?: 0 min  Stress: No Stress Concern Present (01/18/2023)   Received from [facility] of Occupational Health - Occupational Stress Questionnaire    Feeling of Stress : Not at all  Social Connections: Not on file  Intimate Partner Violence: Not At Risk (01/18/2023)   Received from [facility]   Humiliation, Afraid, Rape, and Kick questionnaire    Within the last year, have you been afraid of your partner or ex-partner?: No    Within the last year, have you been humiliated or emotionally abused in other ways by your partner or ex-partner?: No    Within the last year, have you been kicked, hit, slapped, or otherwise physically hurt by your partner or ex-partner?: No    Within the last year, have you been raped or forced to have any kind of sexual activity by your partner or ex-partner?: No    Past Surgical History:  Procedure Laterality Date   COLONOSCOPY     TOTAL KNEE ARTHROPLASTY Left 04/28/2020   Procedure: TOTAL KNEE ARTHROPLASTY;  Surgeon: Molli Angelucci, MD;  Location: [facility] ORS;  Service: Orthopedics;  Laterality: Left;   TOTAL KNEE ARTHROPLASTY Right 09/16/2021   Procedure: TOTAL KNEE ARTHROPLASTY;  Surgeon: Molli Angelucci, MD;  Location: [facility] ORS;  Service: Orthopedics;  Laterality: Right;   TRACHEOSTOMY     DUE TO REACTION FROM LISNOPRIL    Family History  Problem Relation Age of Onset   Heart attack Mother     Allergies  Allergen Reactions   Lisinopril Anaphylaxis and Shortness Of Breath    Throat closes had to be tracheid.   Angioedema requiring tracheostomy       Latest Ref Rng & Units 09/18/2021    4:30 AM 09/17/2021    4:20 AM 09/06/2021   10:58 AM  CBC  WBC 4.0 - 10.5 K/uL 6.4  6.8  5.0   Hemoglobin 13.0 - 17.0 g/dL 47.8  29.5  62.1   Hematocrit 39.0 - 52.0 % 33.6  37.7  45.4   Platelets 150 - 400 K/uL 167  214  210        CMP     Component Value Date/Time   NA 137 09/17/2021 0420   NA 136 03/20/2013 0451   K 3.4 (L) 09/17/2021 0420   K 3.5 03/20/2013 0451   CL 102 09/17/2021 0420   CL 100 03/20/2013 0451   CO2 27 09/17/2021 0420   CO2 29 03/20/2013 0451   GLUCOSE 83 09/17/2021  0420   GLUCOSE 146 (H) 03/20/2013 0451   BUN 19 09/17/2021 0420   BUN 15 03/20/2013 0451   CREATININE 1.36 (H) 09/17/2021 0420   CREATININE 1.34 (H) 03/20/2013 0451   CALCIUM  8.8 (L) 09/17/2021 0420   CALCIUM  9.4 03/20/2013 0451   PROT 7.4 09/06/2021 1058   PROT 9.1 (H) 03/17/2013 1757   ALBUMIN 4.3 09/06/2021 1058   ALBUMIN 4.5 03/17/2013 1757   AST 23 09/06/2021 1058   AST 26 03/17/2013 1757   ALT 23 09/06/2021 1058   ALT 52 03/17/2013 1757   ALKPHOS 46 09/06/2021 1058   ALKPHOS 86 03/17/2013 1757   BILITOT 1.1 09/06/2021 1058   BILITOT 0.9 03/17/2013 1757   GFRNONAA 56 (L) 09/17/2021 0420   GFRNONAA 56 (L) 03/20/2013 0451     No results found.     Assessment & Plan:   1. Bilateral carotid artery stenosis (Primary) Recommend:  Given the patient's asymptomatic subcritical stenosis no further invasive testing or surgery at this time.  Duplex ultrasound shows 1 to 39% stenosis of the right and occlusion of the left.  This is a difference in the previous studies and that the stenosis is noted to be lower on the right.  We had a discussion regarding occlusion of internal carotid arteries.  I discussed that unfortunately once an artery is occluded we are unable to intervene once it is reach 100% due to the significant risk of stroke.  Continue antiplatelet therapy as prescribed Continue management of CAD, HTN and Hyperlipidemia Healthy heart diet,  encouraged exercise at least 4 times per week  Follow up in 6 months with duplex ultrasound and physical exam studies remain on the lower and we will likely move the patient to annual follow-ups.  2. Essential hypertension Continue antihypertensive  medications as already ordered, these medications have been reviewed and there are no changes at this time.  3. Mixed hyperlipidemia Continue statin as ordered and reviewed, no changes at this time   Current Outpatient Medications on File Prior to Visit  Medication Sig Dispense Refill   amLODipine  (NORVASC ) 10 MG tablet Take 10 mg by mouth every morning.     atorvastatin  (LIPITOR) 40 MG tablet Take 40 mg by mouth daily.     bisacodyl  (DULCOLAX) 5 MG EC tablet Take 1 tablet (5 mg total) by mouth daily as needed for moderate constipation. 30 tablet 0   carvedilol  (COREG ) 25 MG tablet Take 25 mg by mouth 2 (two) times daily with a meal.     chlorthalidone  (HYGROTON ) 25 MG tablet Take 25 mg by mouth daily.     clopidogrel  (PLAVIX ) 75 MG tablet Take 75 mg by mouth daily.     Continuous Glucose Sensor (FREESTYLE LIBRE 14 DAY SENSOR) MISC by Does not apply route.     docusate sodium  (COLACE) 100 MG capsule Take 1 capsule (100 mg total) by mouth 2 (two) times daily. (Patient taking differently: Take 100 mg by mouth daily as needed.) 10 capsule 0   Dulaglutide (TRULICITY) 0.75 MG/0.5ML SOPN Inject 0.75 mg into the skin once a week.     empagliflozin (JARDIANCE) 25 MG TABS tablet Take 25 mg by mouth daily.     famotidine  (PEPCID ) 40 MG tablet Take 40 mg by mouth daily.     gabapentin  (NEURONTIN ) 300 MG capsule Take 300 mg by mouth 2 (two) times daily.     hydrALAZINE  (APRESOLINE ) 25 MG tablet Take 25 mg by mouth 3 (three) times daily.  HYDROcodone -acetaminophen  (NORCO/VICODIN) 5-325 MG tablet Take 1-2 tablets by mouth every 4 (four) hours as needed for moderate pain (pain score 4-6). (Patient not taking: Reported on 06/09/2022) 40 tablet 0   hydrOXYzine  (ATARAX ) 10 MG tablet Take 10 mg by mouth 3 (three) times daily as needed for itching.     LANTUS  SOLOSTAR 100 UNIT/ML Solostar Pen Inject 36 Units into the skin at bedtime.     metFORMIN  (GLUCOPHAGE ) 1000 MG tablet Take 1,000 mg by mouth in the  morning and at bedtime.     methocarbamol  (ROBAXIN ) 500 MG tablet Take 1 tablet (500 mg total) by mouth every 6 (six) hours as needed for muscle spasms. (Patient not taking: Reported on 06/09/2022) 30 tablet 0   nicotine (NICODERM CQ - DOSED IN MG/24 HOURS) 14 mg/24hr patch Place 14 mg onto the skin daily.     senna-docusate (SENOKOT-S) 8.6-50 MG tablet Take 1 tablet by mouth at bedtime as needed for mild constipation. (Patient not taking: Reported on 06/09/2022)     traMADol  (ULTRAM ) 50 MG tablet Take 1 tablet (50 mg total) by mouth every 6 (six) hours. 30 tablet 0   traZODone  (DESYREL ) 50 MG tablet Take 50 mg by mouth at bedtime.     VICTOZA  18 MG/3ML SOPN Inject 1.8 mg into the skin every morning. (Patient not taking: Reported on 06/09/2022)     No current facility-administered medications on file prior to visit.    There are no Patient Instructions on file for this visit. No follow-ups on file.   Marleena Shubert E Hemi Chacko, NP

## 2024-01-11 ENCOUNTER — Other Ambulatory Visit (INDEPENDENT_AMBULATORY_CARE_PROVIDER_SITE_OTHER): Payer: Self-pay | Admitting: Nurse Practitioner

## 2024-01-11 DIAGNOSIS — I6523 Occlusion and stenosis of bilateral carotid arteries: Secondary | ICD-10-CM

## 2024-01-24 ENCOUNTER — Ambulatory Visit (INDEPENDENT_AMBULATORY_CARE_PROVIDER_SITE_OTHER): Admitting: Nurse Practitioner

## 2024-01-24 ENCOUNTER — Encounter (INDEPENDENT_AMBULATORY_CARE_PROVIDER_SITE_OTHER)

## 2024-01-30 ENCOUNTER — Ambulatory Visit (INDEPENDENT_AMBULATORY_CARE_PROVIDER_SITE_OTHER): Admitting: Nurse Practitioner

## 2024-01-30 ENCOUNTER — Encounter (INDEPENDENT_AMBULATORY_CARE_PROVIDER_SITE_OTHER)

## 2024-02-23 ENCOUNTER — Ambulatory Visit (INDEPENDENT_AMBULATORY_CARE_PROVIDER_SITE_OTHER)

## 2024-02-23 ENCOUNTER — Ambulatory Visit (INDEPENDENT_AMBULATORY_CARE_PROVIDER_SITE_OTHER): Admitting: Nurse Practitioner

## 2024-02-23 ENCOUNTER — Encounter (INDEPENDENT_AMBULATORY_CARE_PROVIDER_SITE_OTHER): Payer: Self-pay | Admitting: Nurse Practitioner

## 2024-02-23 VITALS — BP 166/98 | HR 75 | Resp 17 | Ht 66.0 in | Wt 200.0 lb

## 2024-02-23 DIAGNOSIS — E782 Mixed hyperlipidemia: Secondary | ICD-10-CM | POA: Diagnosis not present

## 2024-02-23 DIAGNOSIS — I6523 Occlusion and stenosis of bilateral carotid arteries: Secondary | ICD-10-CM

## 2024-02-23 DIAGNOSIS — I10 Essential (primary) hypertension: Secondary | ICD-10-CM | POA: Diagnosis not present

## 2024-03-02 ENCOUNTER — Encounter (INDEPENDENT_AMBULATORY_CARE_PROVIDER_SITE_OTHER): Payer: Self-pay | Admitting: Nurse Practitioner

## 2024-03-02 NOTE — Progress Notes (Signed)
 "  Subjective:    Patient ID: Richard Stark, male    DOB: [birth year], 74 y.o.   MRN: [id number] Chief Complaint  Patient presents with   Follow-up     6 months + Carotid     HPI  Discussed the use of AI scribe software for clinical note transcription with the patient, who gave verbal consent to proceed.  History of Present Illness Richard Stark is a 74 year old male with bilateral carotid artery disease who presents for routine vascular surgery follow-up.  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 Subsequent studies showed a lower degree of right carotid stenosis (1-39%), and the most recent imaging shows approximately 40-59% narrowing on the right. 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  He denies new or concerning symptoms, including unilateral facial numbness or paresthesia, dizziness, syncope, acute visual changes, and neck pain.    Results Diagnostic Carotid duplex ultrasound: Right carotid artery 50-69% stenosis; left carotid artery occlusion   Review of Systems  All other systems reviewed and are negative.      Objective:   Physical Exam Vitals reviewed.  HENT:     Head: Normocephalic.  Neck:     Vascular: No carotid bruit.  Cardiovascular:     Rate and Rhythm: Normal rate.  Pulmonary:     Effort: Pulmonary effort is normal.  Skin:    General: Skin is warm and dry.  Neurological:     Mental Status: He is alert and oriented to person, place, and time.  Psychiatric:        Mood and Affect: Mood normal.        Behavior: Behavior normal.        Thought Content: Thought content normal.        Judgment: Judgment normal.     Physical Exam    BP (!) 166/98   Pulse 75   Resp 17   Ht 5' 6 (1.676 m)   Wt 200 lb (90.7 kg)   BMI 32.28 kg/m   Past Medical History:  Diagnosis Date   Arthritis    Diabetes mellitus without complication  (HCC)    GERD (gastroesophageal reflux disease)    OCC NO MEDS   Hypertension    Stroke (HCC) 2008   RIGHT ARM WEAKNESS    Social History   Socioeconomic History   Marital status: Divorced    Spouse name: Not on file   Number of children: Not on file   Years of education: Not on file   Highest education level: Not on file  Occupational History   Not on file  Tobacco Use   Smoking status: Every Day    Current packs/day: 0.50    Average packs/day: 0.5 packs/day for 30.0 years (15.0 ttl pk-yrs)    Types: Cigarettes   Smokeless tobacco: Never  Vaping Use   Vaping status: Never Used  Substance and Sexual Activity   Alcohol use: No   Drug use: Never   Sexual activity: Not on file  Other Topics Concern   Not on file  Social History Narrative   Not on file   Social Drivers of Health   Tobacco Use: High Risk (03/02/2024)   Patient History    Smoking Tobacco Use: Every Day    Smokeless Tobacco Use: Never    Passive Exposure: Not on file  Financial Resource Strain: Medium Risk (12/11/2023)   Received  from [facility]   Overall Financial Resource Strain (CARDIA)    Difficulty of Paying Living Expenses: Somewhat hard  Food Insecurity: No Food Insecurity (12/11/2023)   Received from [facility]   Epic    Within the past 12 months, you worried that your food would run out before you got the money to buy more.: Never true    Within the past 12 months, the food you bought just didn't last and you didn't have money to get more.: Never true  Transportation Needs: No Transportation Needs (12/11/2023)   Received from [facility] - Transportation    In the past 12 months, has lack of transportation kept you from medical appointments or from getting medications?: No    Lack of Transportation (Non-Medical): No  Physical Activity: Insufficiently Active (08/21/2023)   Received from [facility]   Exercise Vital Sign    On  average, how many days per week do you engage in moderate to strenuous exercise (like a brisk walk)?: 1 day    On average, how many minutes do you engage in exercise at this level?: 30 min  Stress: No Stress Concern Present (01/18/2023)   Received from [facility] - Occupational Stress Questionnaire    Feeling of Stress : Not at all  Social Connections: Not on file  Intimate Partner Violence: Not At Risk (08/21/2023)   Received from [facility]   Epic    Within the last year, have you been afraid of your partner or ex-partner?: No    Within the last year, have you been humiliated or emotionally abused in other ways by your partner or ex-partner?: No    Within the last year, have you been kicked, hit, slapped, or otherwise physically hurt by your partner or ex-partner?: No    Within the last year, have you been raped or forced to have any kind of sexual activity by your partner or ex-partner?: No  Depression (PHQ2-9): Not on file  Alcohol Screen: Not on file  Housing: Low Risk  (12/11/2023)   Received from [facility]   Epic    In the last 12 months, was there a time when you were not able to pay the mortgage or rent on time?: No    In the past 12 months, how many times have you moved where you were living?: 0    At any time in the past 12 months, were you homeless or living in a shelter (including now)?: No  Utilities: Not At Risk (12/11/2023)   Received from [facility]   Epic    In the past 12 months has the electric, gas, oil, or water company threatened to shut off services in your home?: No  Health Literacy: Low Risk (08/21/2023)   Received from [facility] Literacy    How often do you need to have someone help you when you read instructions, pamphlets, or other written material from your doctor or pharmacy?: Never    Past Surgical History:  Procedure Laterality Date   COLONOSCOPY      TOTAL KNEE ARTHROPLASTY Left 04/28/2020   Procedure: TOTAL KNEE ARTHROPLASTY;  Surgeon: Kathlynn Sharper, MD;  Location: [facility] ORS;  Service: Orthopedics;  Laterality: Left;   TOTAL KNEE ARTHROPLASTY Right 09/16/2021   Procedure: TOTAL KNEE ARTHROPLASTY;  Surgeon: Kathlynn Sharper, MD;  Location: [facility] ORS;  Service: Orthopedics;  Laterality: Right;   TRACHEOSTOMY     DUE TO REACTION FROM LISNOPRIL    Family History  Problem Relation Age of Onset   Heart attack Mother     Allergies[1]     Latest Ref Rng & Units 09/18/2021    4:30 AM 09/17/2021    4:20 AM 09/06/2021   10:58 AM  CBC  WBC 4.0 - 10.5 K/uL 6.4  6.8  5.0   Hemoglobin 13.0 - 17.0 g/dL 88.4  87.1  84.4   Hematocrit 39.0 - 52.0 % 33.6  37.7  45.4   Platelets 150 - 400 K/uL 167  214  210       CMP     Component Value Date/Time   NA 137 09/17/2021 0420   NA 136 03/20/2013 0451   K 3.4 (L) 09/17/2021 0420   K 3.5 03/20/2013 0451   CL 102 09/17/2021 0420   CL 100 03/20/2013 0451   CO2 27 09/17/2021 0420   CO2 29 03/20/2013 0451   GLUCOSE 83 09/17/2021 0420   GLUCOSE 146 (H) 03/20/2013 0451   BUN 19 09/17/2021 0420   BUN 15 03/20/2013 0451   CREATININE 1.36 (H) 09/17/2021 0420   CREATININE 1.34 (H) 03/20/2013 0451   CALCIUM  8.8 (L) 09/17/2021 0420   CALCIUM  9.4 03/20/2013 0451   PROT 7.4 09/06/2021 1058   PROT 9.1 (H) 03/17/2013 1757   ALBUMIN 4.3 09/06/2021 1058   ALBUMIN 4.5 03/17/2013 1757   AST 23 09/06/2021 1058   AST 26 03/17/2013 1757   ALT 23 09/06/2021 1058   ALT 52 03/17/2013 1757   ALKPHOS 46 09/06/2021 1058   ALKPHOS 86 03/17/2013 1757   BILITOT 1.1 09/06/2021 1058   BILITOT 0.9 03/17/2013 1757   GFRNONAA 56 (L) 09/17/2021 0420   GFRNONAA 56 (L) 03/20/2013 0451     No results found.     Assessment & Plan:   1. Bilateral carotid artery stenosis (Primary) Bilateral carotid artery stenosis Chronic bilateral carotid artery stenosis with left carotid occlusion and 50% right carotid stenosis. No  interval change. No surgical intervention indicated unless stenosis exceeds 70-75% or symptoms develop. - Scheduled follow-up in 6 months for carotid artery surveillance. - Advised immediate medical attention for acute neurological symptoms such as sudden paresthesia, visual disturbances, vertigo, or syncope.  2. Essential hypertension Continue antihypertensive medications as already ordered, these medications have been reviewed and there are no changes at this time.  3. Mixed hyperlipidemia Continue statin as ordered and reviewed, no changes at this time   Assessment and Plan Assessment & Plan      Medications Ordered Prior to Encounter[2]  There are no Patient Instructions on file for this visit. Return in about 6 months (around 08/22/2024) for carotid GS/FB.   Haevyn Ury E Lindsay Straka, NP      [1]  Allergies Allergen Reactions   Lisinopril Anaphylaxis and Shortness Of Breath    Throat closes had to be tracheid.   Angioedema requiring tracheostomy  [2]  Current Outpatient Medications on File Prior to Visit  Medication Sig Dispense Refill   amLODipine  (NORVASC ) 10 MG tablet Take 10 mg by mouth every morning.     atorvastatin  (LIPITOR) 40 MG tablet Take 40 mg by mouth daily.     bisacodyl  (DULCOLAX) 5 MG EC tablet Take 1 tablet (5 mg total) by mouth daily as needed for moderate constipation. 30 tablet 0   carvedilol  (COREG ) 25 MG tablet Take 25 mg by mouth 2 (two) times  daily with a meal.     chlorthalidone  (HYGROTON ) 25 MG tablet Take 25 mg by mouth daily.     clopidogrel  (PLAVIX ) 75 MG tablet Take 75 mg by mouth daily.     Continuous Glucose Sensor (FREESTYLE LIBRE 14 DAY SENSOR) MISC by Does not apply route.     docusate sodium  (COLACE) 100 MG capsule Take 1 capsule (100 mg total) by mouth 2 (two) times daily. (Patient taking differently: Take 100 mg by mouth daily as needed.) 10 capsule 0   Dulaglutide (TRULICITY) 0.75 MG/0.5ML SOPN Inject 0.75 mg into the skin once a week.      empagliflozin (JARDIANCE) 25 MG TABS tablet Take 25 mg by mouth daily.     famotidine  (PEPCID ) 40 MG tablet Take 40 mg by mouth daily.     gabapentin  (NEURONTIN ) 300 MG capsule Take 300 mg by mouth 2 (two) times daily.     hydrALAZINE  (APRESOLINE ) 25 MG tablet Take 25 mg by mouth 3 (three) times daily.     hydrOXYzine  (ATARAX ) 10 MG tablet Take 10 mg by mouth 3 (three) times daily as needed for itching.     LANTUS  SOLOSTAR 100 UNIT/ML Solostar Pen Inject 36 Units into the skin at bedtime.     metFORMIN  (GLUCOPHAGE ) 1000 MG tablet Take 1,000 mg by mouth in the morning and at bedtime.     nicotine (NICODERM CQ - DOSED IN MG/24 HOURS) 14 mg/24hr patch Place 14 mg onto the skin daily.     traMADol  (ULTRAM ) 50 MG tablet Take 1 tablet (50 mg total) by mouth every 6 (six) hours. 30 tablet 0   traZODone  (DESYREL ) 50 MG tablet Take 50 mg by mouth at bedtime.     HYDROcodone -acetaminophen  (NORCO/VICODIN) 5-325 MG tablet Take 1-2 tablets by mouth every 4 (four) hours as needed for moderate pain (pain score 4-6). (Patient not taking: Reported on 06/09/2022) 40 tablet 0   methocarbamol  (ROBAXIN ) 500 MG tablet Take 1 tablet (500 mg total) by mouth every 6 (six) hours as needed for muscle spasms. (Patient not taking: Reported on 06/09/2022) 30 tablet 0   senna-docusate (SENOKOT-S) 8.6-50 MG tablet Take 1 tablet by mouth at bedtime as needed for mild constipation. (Patient not taking: Reported on 06/09/2022)     VICTOZA  18 MG/3ML SOPN Inject 1.8 mg into the skin every morning. (Patient not taking: Reported on 06/09/2022)     No current facility-administered medications on file prior to visit.   "

## 2024-08-22 ENCOUNTER — Encounter (INDEPENDENT_AMBULATORY_CARE_PROVIDER_SITE_OTHER)

## 2024-08-22 ENCOUNTER — Ambulatory Visit (INDEPENDENT_AMBULATORY_CARE_PROVIDER_SITE_OTHER): Admitting: Nurse Practitioner
